# Patient Record
Sex: Male | Born: 1970
Health system: Southern US, Community
[De-identification: ages and names within clinical notes are randomized; demographics above are authoritative.]

## PROBLEM LIST (undated history)

## (undated) DIAGNOSIS — M109 Gout, unspecified: Secondary | ICD-10-CM

## (undated) DIAGNOSIS — K529 Noninfective gastroenteritis and colitis, unspecified: Secondary | ICD-10-CM

## (undated) DIAGNOSIS — K5090 Crohn's disease, unspecified, without complications: Secondary | ICD-10-CM

## (undated) DIAGNOSIS — M1990 Unspecified osteoarthritis, unspecified site: Secondary | ICD-10-CM

## (undated) HISTORY — PX: ANKLE FRACTURE SURGERY: SHX122

---
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
## 2000-04-01 DIAGNOSIS — K529 Noninfective gastroenteritis and colitis, unspecified: Secondary | ICD-10-CM

## 2000-04-01 HISTORY — PX: COLONOSCOPY: SHX174

## 2000-04-01 HISTORY — DX: Noninfective gastroenteritis and colitis, unspecified: K52.9

## 2003-04-02 HISTORY — PX: ANKLE FRACTURE SURGERY: SHX122

## 2013-10-04 ENCOUNTER — Emergency Department (INDEPENDENT_AMBULATORY_CARE_PROVIDER_SITE_OTHER)
Admission: EM | Admit: 2013-10-04 | Discharge: 2013-10-04 | Disposition: A | Discharge status: Home / Self Care | Payer: PRIVATE HEALTH INSURANCE | Source: Home / Self Care | Attending: Emergency Medicine | Admitting: Emergency Medicine

## 2013-10-04 ENCOUNTER — Encounter: Payer: Self-pay | Admitting: Emergency Medicine

## 2013-10-04 DIAGNOSIS — N509 Disorder of male genital organs, unspecified: Secondary | ICD-10-CM

## 2013-10-04 DIAGNOSIS — N5082 Scrotal pain: Secondary | ICD-10-CM

## 2013-10-04 DIAGNOSIS — N649 Disorder of breast, unspecified: Secondary | ICD-10-CM

## 2013-10-04 DIAGNOSIS — N6489 Other specified disorders of breast: Secondary | ICD-10-CM

## 2013-10-04 HISTORY — DX: Unspecified osteoarthritis, unspecified site: M19.90

## 2013-10-04 HISTORY — DX: Crohn's disease, unspecified, without complications: K50.90

## 2013-10-04 HISTORY — DX: Gout, unspecified: M10.9

## 2013-10-04 LAB — POCT URINALYSIS DIP (MANUAL ENTRY)
BILIRUBIN UA: NEGATIVE
Glucose, UA: NEGATIVE
Leukocytes, UA: NEGATIVE
Nitrite, UA: NEGATIVE
PH UA: 5.5 (ref 5–8)
Urobilinogen, UA: 0.2 (ref 0–1)

## 2013-10-04 MED ORDER — SULFAMETHOXAZOLE-TRIMETHOPRIM 800-160 MG PO TABS: 1.0000 | ORAL_TABLET | Freq: Two times a day (BID) | ORAL | Status: DC

## 2013-10-04 NOTE — ED Provider Notes (Signed)
CSN: [id number]     Arrival date & time 10/04/13  1237 History   First MD Initiated Contact with Patient 10/04/13 1242     Chief Complaint  Patient presents with  . Breast Problem  . Testicle Pain   (Consider location/radiation/quality/duration/timing/severity/associated sxs/prior Treatment) HPI This patient presents with multiple complaints today. He does not have a PCP.Marland Kitchen 1) He has a history of gout but does not current flair and fast for maintenance medication. 2) He has a lesion on his right nipple that has been there for 2 weeks.  Feels like a pimple.  No  Fever, chills, nausea, vomiting, galactorrhea, discharge, bloody discharge.   3) he complains of testicular pain the last month.  No hematuria, dysuria, frequency.  No penile discharge.  Sexually active with girlfriend.  She did not have any symptoms.  No difficulty initiating urine stream.  No history of prostate problems, however he does have to go to the bathroom once at night.  He attributes this to his Crohn's disease more so.   Past Medical History  Diagnosis Date  . Gout   . Crohn disease   . Arthritis    Past Surgical History  Procedure Laterality Date  . Ankle fracture surgery Right    Family History  Problem Relation Age of Onset  . Diabetes Mother    History  Substance Use Topics  . Smoking status: Current Every Day Smoker    Types: Cigars  . Smokeless tobacco: Not on file  . Alcohol Use: Yes    Review of Systems  All other systems reviewed and are negative.   Allergies  Review of patient's allergies indicates no known allergies.  Home Medications   Prior to Admission medications   Medication Sig Start Date End Date Taking? Authorizing Provider  sulfamethoxazole-trimethoprim (SEPTRA DS) 800-160 MG per tablet Take 1 tablet by mouth 2 (two) times daily. 10/04/13   Janeann Forehand, MD   BP 126/80  Pulse 74  Temp(Src) 98.2 F (36.8 C) (Oral)  Resp 18  Ht 6' (1.829 m)  Wt 209 lb (94.802 kg)  BMI  28.34 kg/m2  SpO2 98% Physical Exam  Nursing note and vitals reviewed. Constitutional: He is oriented to person, place, and time. Vital signs are normal. He appears well-developed and well-nourished.  Non-toxic appearance. He does not appear ill.  HENT:  Head: Normocephalic and atraumatic.  Eyes: No scleral icterus.  Neck: Neck supple.  Cardiovascular: Regular rhythm and normal heart sounds.   Pulmonary/Chest: Effort normal and breath sounds normal. No respiratory distress.    Small 1/4 centimeter pustule on the pole, nontender, no discharge.  No breast tissue lesions felt.  Small amount of induration but no fluctuance locally.  No cellulitis.  Abdominal: Hernia confirmed negative in the right inguinal area and confirmed negative in the left inguinal area.  Genitourinary: Testes normal and penis normal. Right testis shows no mass, no swelling and no tenderness. Left testis shows no mass, no swelling and no tenderness.  Neurological: He is alert and oriented to person, place, and time.  Skin: Skin is warm and dry.  Psychiatric: He has a normal mood and affect. His speech is normal.    ED Course  Procedures (including critical care time) Labs Review Labs Reviewed  URINE CULTURE  POCT URINALYSIS DIP (MANUAL ENTRY)    Imaging Review No results found.   MDM   1. Scrotum pain   2. Nipple lesion    1) Patient given information for next-door primary  care physicians so they can follow him chronically for gouty symptoms.  In the meantime can take anti-inflammatories over-the-counter if necessary but currently no symptoms.  He will gonow and make an appointment for follow up of these problems.  2) this is an unknown type of nipple lesion, however looks like a back up doctor versus very small abscess.  I'm going to give him a prescription for Bactrim.  If not improving then may need to have a 90, however I would like to see if the antibiotics help first.  Warm compresses encouraged which  may help rupture on its own. 3) UA & UCx obtained.  Due to age, likely not GC/Chl, but if not improved would consider this.  No signs c/w torsion.  DDx includes epidydmitis/prostatitis.  Will give Bactrim DS to treat this as above as well.      Janeann Forehand, MD 10/04/13 [phone]

## 2013-10-04 NOTE — ED Notes (Signed)
Pt c/o RT nipple abscess x 2 wks with some pain. Denies any breast mass. He also c/o problem with his scrotum x 1 mth. Denies dysuria.

## 2013-10-05 LAB — URINE CULTURE
Colony Count: NO GROWTH
Organism ID, Bacteria: NO GROWTH

## 2013-10-07 ENCOUNTER — Telehealth: Payer: Self-pay | Admitting: Emergency Medicine

## 2013-10-15 ENCOUNTER — Encounter: Payer: Self-pay | Admitting: Family Medicine

## 2013-10-15 ENCOUNTER — Ambulatory Visit (INDEPENDENT_AMBULATORY_CARE_PROVIDER_SITE_OTHER): Payer: PRIVATE HEALTH INSURANCE | Admitting: Family Medicine

## 2013-10-15 VITALS — BP 105/65 | HR 80 | Ht 72.0 in | Wt 208.0 lb

## 2013-10-15 DIAGNOSIS — M109 Gout, unspecified: Secondary | ICD-10-CM | POA: Insufficient documentation

## 2013-10-15 DIAGNOSIS — K219 Gastro-esophageal reflux disease without esophagitis: Secondary | ICD-10-CM | POA: Insufficient documentation

## 2013-10-15 DIAGNOSIS — M1A0710 Idiopathic chronic gout, right ankle and foot, without tophus (tophi): Secondary | ICD-10-CM

## 2013-10-15 DIAGNOSIS — M1A00X0 Idiopathic chronic gout, unspecified site, without tophus (tophi): Secondary | ICD-10-CM

## 2013-10-15 DIAGNOSIS — Z720 Tobacco use: Secondary | ICD-10-CM | POA: Insufficient documentation

## 2013-10-15 DIAGNOSIS — Z0000 Encounter for general adult medical examination without abnormal findings: Secondary | ICD-10-CM

## 2013-10-15 DIAGNOSIS — F17200 Nicotine dependence, unspecified, uncomplicated: Secondary | ICD-10-CM

## 2013-10-15 MED ORDER — ALLOPURINOL 100 MG PO TABS: 100.0000 mg | ORAL_TABLET | Freq: Every day | ORAL | Status: DC

## 2013-10-15 MED ORDER — COLCHICINE 0.6 MG PO CAPS: 0.6000 | ORAL_CAPSULE | Freq: Every day | ORAL | Status: DC

## 2013-10-15 NOTE — Progress Notes (Signed)
Subjective:    Patient ID: Andre Henderson, male    DOB: [birth year], 42 y.o.   MRN: [id number]  HPI Was told at one point may have crohn's . Gets blood in the stool about twice a month.  Say will see it on the paper. Says diet seem to really affect his symptoms.   Hx of surgery on right ankle after an accident.    Hx of gout.  Taking a cherry seed extract.  Dx about 3-4 months ago.  Thought has had sxs on and off x 4 years.  He does eat a lot of red meat.    GERD - Admits to eating greasy foods.  Drinks beer  Has a high stress job.  Doesn't get a lot of sleep. Drink a lot of mountain Dew.   Review of Systems  Constitutional: Negative for fever, diaphoresis and unexpected weight change.  HENT: Negative for hearing loss, rhinorrhea, sneezing and tinnitus.   Eyes: Positive for visual disturbance.  Respiratory: Positive for cough. Negative for wheezing.   Cardiovascular: Negative for chest pain and palpitations.  Gastrointestinal: Positive for blood in stool. Negative for nausea, vomiting and diarrhea.  Genitourinary: Negative for dysuria and discharge.  Musculoskeletal: Negative for arthralgias and myalgias.  Skin: Negative for rash.  Neurological: Negative for headaches.  Hematological: Negative for adenopathy.  Psychiatric/Behavioral: Positive for sleep disturbance. Negative for dysphoric mood. The patient is not nervous/anxious.         BP 105/65  Pulse 80  Ht 6' (1.829 m)  Wt 208 lb (94.348 kg)  BMI 28.20 kg/m2    No Known Allergies  Past Medical History  Diagnosis Date  . Gout   . Crohn disease   . Arthritis     Past Surgical History  Procedure Laterality Date  . Ankle fracture surgery Right     History   Social History  . Marital Status: Single    Spouse Name: [name]    Number of Children: N/A  . Years of Education: N/A   Occupational History  . SALES MANAGER    Social History Main Topics  . Smoking status: Current Every Day Smoker    Types: Cigars   . Smokeless tobacco: Not on file  . Alcohol Use: 6.0 oz/week    10 Cans of beer per week  . Drug Use: No  . Sexual Activity: Yes    Partners: Female   Other Topics Concern  . Not on file   Social History Narrative   4 sodas per day. No regular exercise. Does smoke a cigar from time to time. 10 beers per week. He currently lives with [name] currently works at [employer] History  Problem Relation Age of Onset  . Diabetes Mother   . Hyperlipidemia Mother   . Hypertension Mother     Outpatient Encounter Prescriptions as of 10/15/2013  Medication Sig  . allopurinol (ZYLOPRIM) 100 MG tablet Take 1 tablet (100 mg total) by mouth daily.  . Colchicine (MITIGARE) 0.6 MG CAPS Take 0.6 capsules by mouth daily.  . [DISCONTINUED] sulfamethoxazole-trimethoprim (SEPTRA DS) 800-160 MG per tablet Take 1 tablet by mouth 2 (two) times daily.       Objective:   Physical Exam  Constitutional: He is oriented to person, place, and time. He appears well-developed and well-nourished.  HENT:  Head: Normocephalic and atraumatic.  Neck: Neck supple. No thyromegaly present.  Cardiovascular: Normal rate, regular rhythm and normal heart sounds.  No carotid bruits  Pulmonary/Chest: Effort normal and breath sounds normal.  Neurological: He is alert and oriented to person, place, and time.  Skin: Skin is warm and dry.  Psychiatric: He has a normal mood and affect. His behavior is normal.          Assessment & Plan:  Gout - Discussed dx. we also discussed the difference between prophylactic treatment to prevent destruction the joint vessels acute treatment. We discussed the pros and cons of the medication and the need to follow uric acid levels. Will try call the urgent care to get a copy of his previous uric acid drawn about 2-3 months ago. We'll go ahead and start him on allopurinol and colchicine for 6 months. At that point time we'll stop the colchicine and does use  it as needed but continue the allopurinol. We will need to recheck uric acid in about 2-3 months that we can adjust his dose.  GERD- controlled on PPI.  We discussed the importance of really controlling diet which she has not done a good job of to really reduce his use of PPIs. We did discuss the chronic use of PPIs increased risk of fractures and bowel infections.  Strongly encouraged him to schedule a physical so that we can get blood work and get him up-to-date on vaccines et Ronney Asters. He has never had a complete physical and does not report having any recent vaccinations done.

## 2013-10-29 ENCOUNTER — Encounter: Payer: PRIVATE HEALTH INSURANCE | Admitting: Family Medicine

## 2013-11-18 LAB — COMPLETE METABOLIC PANEL WITH GFR
ALBUMIN: 4.4 g/dL (ref 3.5–5.2)
ALT: 69 U/L — ABNORMAL HIGH (ref 0–53)
AST: 31 U/L (ref 0–37)
Alkaline Phosphatase: 45 U/L (ref 39–117)
BUN: 15 mg/dL (ref 6–23)
CHLORIDE: 99 meq/L (ref 96–112)
CO2: 27 mEq/L (ref 19–32)
Calcium: 9.9 mg/dL (ref 8.4–10.5)
Creat: 0.93 mg/dL (ref 0.50–1.35)
GFR, Est African American: >89 mL/min
GLUCOSE: 111 mg/dL — AB (ref 70–99)
POTASSIUM: 4.6 meq/L (ref 3.5–5.3)
Sodium: 136 mEq/L (ref 135–145)
TOTAL PROTEIN: 7.2 g/dL (ref 6.0–8.3)
Total Bilirubin: 0.4 mg/dL (ref 0.2–1.2)

## 2013-11-18 LAB — LIPID PANEL
CHOLESTEROL: 216 mg/dL — AB (ref 0–200)
HDL: 45 mg/dL (ref >39)
LDL Cholesterol: 115 mg/dL — ABNORMAL HIGH (ref 0–99)
Total CHOL/HDL Ratio: 4.8 Ratio
Triglycerides: 281 mg/dL — ABNORMAL HIGH (ref <150)
VLDL: 56 mg/dL — ABNORMAL HIGH (ref 0–40)

## 2013-11-18 LAB — URIC ACID: URIC ACID, SERUM: 10.6 mg/dL — AB (ref 4.0–7.8)

## 2013-11-19 ENCOUNTER — Encounter: Payer: Self-pay | Admitting: Family Medicine

## 2013-11-19 ENCOUNTER — Ambulatory Visit (INDEPENDENT_AMBULATORY_CARE_PROVIDER_SITE_OTHER): Payer: PRIVATE HEALTH INSURANCE | Admitting: Family Medicine

## 2013-11-19 VITALS — BP 126/74 | HR 80 | Wt 208.0 lb

## 2013-11-19 DIAGNOSIS — R7309 Other abnormal glucose: Secondary | ICD-10-CM

## 2013-11-19 DIAGNOSIS — E1165 Type 2 diabetes mellitus with hyperglycemia: Secondary | ICD-10-CM | POA: Insufficient documentation

## 2013-11-19 DIAGNOSIS — R7301 Impaired fasting glucose: Secondary | ICD-10-CM | POA: Insufficient documentation

## 2013-11-19 DIAGNOSIS — Z0000 Encounter for general adult medical examination without abnormal findings: Secondary | ICD-10-CM

## 2013-11-19 DIAGNOSIS — E119 Type 2 diabetes mellitus without complications: Secondary | ICD-10-CM | POA: Insufficient documentation

## 2013-11-19 DIAGNOSIS — Z23 Encounter for immunization: Secondary | ICD-10-CM

## 2013-11-19 LAB — POCT GLYCOSYLATED HEMOGLOBIN (HGB A1C): Hemoglobin A1C: 5.9

## 2013-11-19 MED ORDER — ALLOPURINOL 100 MG PO TABS: 200.0000 mg | ORAL_TABLET | Freq: Every day | ORAL | Status: DC

## 2013-11-19 NOTE — Addendum Note (Signed)
Addended by: Teddy Spike on: 11/19/2013 05:47 PM   Modules accepted: Orders

## 2013-11-19 NOTE — Progress Notes (Signed)
Subjective:    Patient ID: Andre Henderson, male    DOB: 01/18/1971, 42 y.o.   MRN: [id number]  HPI Here for CPE today. No specific complaints.    Review of Systems  BP 126/74  Pulse 80  Wt 208 lb (94.348 kg)    No Known Allergies  Past Medical History  Diagnosis Date  . Gout   . Crohn disease   . Arthritis     Past Surgical History  Procedure Laterality Date  . Ankle fracture surgery Right     History   Social History  . Marital Status: Single    Spouse Name: [name]    Number of Children: N/A  . Years of Education: N/A   Occupational History  . SALES MANAGER    Social History Main Topics  . Smoking status: Current Every Day Smoker    Types: Cigars  . Smokeless tobacco: Not on file  . Alcohol Use: 6.0 oz/week    10 Cans of beer per week  . Drug Use: No  . Sexual Activity: Yes    Partners: Female   Other Topics Concern  . Not on file   Social History Narrative   4 sodas per day. No regular exercise. Does smoke a cigar from time to time. 10 beers per week. He currently lives with [name] currently works at [employer] History  Problem Relation Age of Onset  . Diabetes Mother   . Hyperlipidemia Mother   . Hypertension Mother     Outpatient Encounter Prescriptions as of 11/19/2013  Medication Sig  . allopurinol (ZYLOPRIM) 100 MG tablet Take 2 tablets (200 mg total) by mouth daily.  . Colchicine (MITIGARE) 0.6 MG CAPS Take 0.6 capsules by mouth daily.  . [DISCONTINUED] allopurinol (ZYLOPRIM) 100 MG tablet Take 1 tablet (100 mg total) by mouth daily.          Objective:   Physical Exam  Constitutional: He is oriented to person, place, and time. He appears well-developed and well-nourished.  HENT:  Head: Normocephalic and atraumatic.  Right Ear: External ear normal.  Left Ear: External ear normal.  Nose: Nose normal.  Mouth/Throat: Oropharynx is clear and moist.  Eyes: Conjunctivae and EOM are normal. Pupils  are equal, round, and reactive to light.  Neck: Normal range of motion. Neck supple. No thyromegaly present.  Cardiovascular: Normal rate, regular rhythm, normal heart sounds and intact distal pulses.   Pulmonary/Chest: Effort normal and breath sounds normal.  Abdominal: Soft. Bowel sounds are normal. He exhibits no distension and no mass. There is no tenderness. There is no rebound and no guarding.  Musculoskeletal: Normal range of motion.  Lymphadenopathy:    He has no cervical adenopathy.  Neurological: He is alert and oriented to person, place, and time. He has normal reflexes.  Skin: Skin is warm and dry.  Psychiatric: He has a normal mood and affect. His behavior is normal. Judgment and thought content normal.          Assessment & Plan:  CPE:  Keep up a regular exercise program and make sure you are eating a healthy diet Try to eat 4 servings of dairy a day, or if you are lactose intolerant take a calcium with vitamin D daily.  Your vaccines are up to date.  Tdap updated today.   Abnormal glucose - A1C in the IFG zone. Discussed working on diet and exercise.  Really work on decreasing Mtn Dew significantly.  Says  drinks about 4-5 12oz cans per day.  Recheck in 6 months. Also work on increasing acitivity levels.

## 2014-05-23 ENCOUNTER — Ambulatory Visit: Payer: PRIVATE HEALTH INSURANCE | Admitting: Family Medicine

## 2015-02-10 ENCOUNTER — Ambulatory Visit (INDEPENDENT_AMBULATORY_CARE_PROVIDER_SITE_OTHER): Payer: 59 | Admitting: Family Medicine

## 2015-02-10 ENCOUNTER — Encounter: Payer: Self-pay | Admitting: Family Medicine

## 2015-02-10 VITALS — BP 150/78 | HR 82 | Temp 98.4°F | Resp 18 | Wt 204.9 lb

## 2015-02-10 DIAGNOSIS — K219 Gastro-esophageal reflux disease without esophagitis: Secondary | ICD-10-CM

## 2015-02-10 DIAGNOSIS — M10071 Idiopathic gout, right ankle and foot: Secondary | ICD-10-CM

## 2015-02-10 DIAGNOSIS — G4700 Insomnia, unspecified: Secondary | ICD-10-CM | POA: Diagnosis not present

## 2015-02-10 DIAGNOSIS — R7301 Impaired fasting glucose: Secondary | ICD-10-CM

## 2015-02-10 LAB — POCT GLYCOSYLATED HEMOGLOBIN (HGB A1C): HEMOGLOBIN A1C: 5.8

## 2015-02-10 MED ORDER — PREDNISONE 20 MG PO TABS: ORAL_TABLET | ORAL | Status: DC

## 2015-02-10 MED ORDER — ALLOPURINOL 100 MG PO TABS
200.0000 mg | ORAL_TABLET | Freq: Every day | ORAL | Status: DC
Start: 2015-02-10 — End: 2015-03-31

## 2015-02-10 MED ORDER — COLCHICINE 0.6 MG PO CAPS: 0.6000 | ORAL_CAPSULE | Freq: Every day | ORAL | Status: DC

## 2015-02-10 NOTE — Patient Instructions (Signed)
Insomnia Insomnia is a sleep disorder that makes it difficult to fall asleep or to stay asleep. Insomnia can cause tiredness (fatigue), low energy, difficulty concentrating, mood swings, and poor performance at work or school.  There are three different ways to classify insomnia:  Difficulty falling asleep.  Difficulty staying asleep.  Waking up too early in the morning. Any type of insomnia can be long-term (chronic) or short-term (acute). Both are common. Short-term insomnia usually lasts for three months or less. Chronic insomnia occurs at least three times a week for longer than three months. CAUSES  Insomnia may be caused by another condition, situation, or substance, such as:  Anxiety.  Certain medicines.  Gastroesophageal reflux disease (GERD) or other gastrointestinal conditions.  Asthma or other breathing conditions.  Restless legs syndrome, sleep apnea, or other sleep disorders.  Chronic pain.  Menopause. This may include hot flashes.  Stroke.  Abuse of alcohol, tobacco, or illegal drugs.  Depression.  Caffeine.   Neurological disorders, such as Alzheimer disease.  An overactive thyroid (hyperthyroidism). The cause of insomnia may not be known. RISK FACTORS Risk factors for insomnia include:  Gender. Women are more commonly affected than men.  Age. Insomnia is more common as you get older.  Stress. This may involve your professional or personal life.  Income. Insomnia is more common in people with lower income.  Lack of exercise.   Irregular work schedule or night shifts.  Traveling between different time zones. SIGNS AND SYMPTOMS If you have insomnia, trouble falling asleep or trouble staying asleep is the main symptom. This may lead to other symptoms, such as:  Feeling fatigued.  Feeling nervous about going to sleep.  Not feeling rested in the morning.  Having trouble concentrating.  Feeling irritable, anxious, or depressed. TREATMENT   Treatment for insomnia depends on the cause. If your insomnia is caused by an underlying condition, treatment will focus on addressing the condition. Treatment may also include:   Medicines to help you sleep.  Counseling or therapy.  Lifestyle adjustments. HOME CARE INSTRUCTIONS   Take medicines only as directed by your health care provider.  Keep regular sleeping and waking hours. Avoid naps.  Keep a sleep diary to help you and your health care provider figure out what could be causing your insomnia. Include:   When you sleep.  When you wake up during the night.  How well you sleep.   How rested you feel the next day.  Any side effects of medicines you are taking.  What you eat and drink.   Make your bedroom a comfortable place where it is easy to fall asleep:  Put up shades or special blackout curtains to block light from outside.  Use a white noise machine to block noise.  Keep the temperature cool.   Exercise regularly as directed by your health care provider. Avoid exercising right before bedtime.  Use relaxation techniques to manage stress. Ask your health care provider to suggest some techniques that may work well for you. These may include:  Breathing exercises.  Routines to release muscle tension.  Visualizing peaceful scenes.  Cut back on alcohol, caffeinated beverages, and cigarettes, especially close to bedtime. These can disrupt your sleep.  Do not overeat or eat spicy foods right before bedtime. This can lead to digestive discomfort that can make it hard for you to sleep.  Limit screen use before bedtime. This includes:  Watching TV.  Using your smartphone, tablet, and computer.  Stick to a routine. This   can help you fall asleep faster. Try to do a quiet activity, brush your teeth, and go to bed at the same time each night.  Get out of bed if you are still awake after 15 minutes of trying to sleep. Keep the lights down, but try reading or  doing a quiet activity. When you feel sleepy, go back to bed.  Make sure that you drive carefully. Avoid driving if you feel very sleepy.  Keep all follow-up appointments as directed by your health care provider. This is important. SEEK MEDICAL CARE IF:   You are tired throughout the day or have trouble in your daily routine due to sleepiness.  You continue to have sleep problems or your sleep problems get worse. SEEK IMMEDIATE MEDICAL CARE IF:   You have serious thoughts about hurting yourself or someone else.   This information is not intended to replace advice given to you by your health care provider. Make sure you discuss any questions you have with your health care provider.   Document Released: 03/15/2000 Document Revised: 12/07/2014 Document Reviewed: 12/17/2013 Elsevier Interactive Patient Education 2016 [employer] Inc[employer]  

## 2015-02-10 NOTE — Progress Notes (Signed)
Subjective:    Patient ID: Andre Henderson, male    DOB: 1970/12/07, 44 y.o.   MRN: [id number]  HPI Right foot pain - started 4 days ago.  Says hot warm, swollen.  She took a hydrocodone. Says it is a little better today. Has been limping.   IFG - No in thirst or urination. She take  PPI - says takes nexium once a day and say some concern on TV about renal function.    Insomnia - he has not been sleeping well for about the last 6 months or so. He got a new job and he is working in [employer]. It's been very difficult to fall asleep at night and says on average she's only getting about 2-3 hours per night. He's tried several over-the-counter medications. He said medications like NyQuil actually seemed to make him more hyper.  Review of Systems     BP 150/78 mmHg  Pulse 82  Temp(Src) 98.4 F (36.9 C) (Oral)  Resp 18  Wt 204 lb 14.4 oz (92.942 kg)  SpO2 98%    No Known Allergies  Past Medical History  Diagnosis Date  . Gout   . Crohn disease ([place])   . Arthritis     Past Surgical History  Procedure Laterality Date  . Ankle fracture surgery Right     Social History   Social History  . Marital Status: Single    Spouse Name: [name]  . Number of Children: N/A  . Years of Education: N/A   Occupational History  . SALES MANAGER    Social History Main Topics  . Smoking status: Current Every Day Smoker    Types: Cigars  . Smokeless tobacco: Not on file  . Alcohol Use: 6.0 oz/week    10 Cans of beer per week  . Drug Use: No  . Sexual Activity:    Partners: Female   Other Topics Concern  . Not on file   Social History Narrative   4 sodas per day. No regular exercise. Does smoke a cigar from time to time. 10 beers per week. He currently lives with [name] currently works at [employer] History  Problem Relation Age of Onset  . Diabetes Mother   . Hyperlipidemia Mother   . Hypertension Mother     Outpatient Encounter Prescriptions as  of 02/10/2015  Medication Sig  . allopurinol (ZYLOPRIM) 100 MG tablet Take 2 tablets (200 mg total) by mouth daily.  . Colchicine (MITIGARE) 0.6 MG CAPS Take 0.6 capsules by mouth daily.  . predniSONE (DELTASONE) 20 MG tablet 2 tabs po QD x 5 days, then 1 tab po QD x 5 day, then half a tab po QD x 5 days.  . [DISCONTINUED] allopurinol (ZYLOPRIM) 100 MG tablet Take 2 tablets (200 mg total) by mouth daily. (Patient not taking: Reported on 02/10/2015)  . [DISCONTINUED] Colchicine (MITIGARE) 0.6 MG CAPS Take 0.6 capsules by mouth daily. (Patient not taking: Reported on 02/10/2015)   No facility-administered encounter medications on file as of 02/10/2015.       Objective:   Physical Exam  Constitutional: He is oriented to person, place, and time. He appears well-developed and well-nourished.  HENT:  Head: Normocephalic and atraumatic.  Cardiovascular: Normal rate, regular rhythm and normal heart sounds.   Pulmonary/Chest: Effort normal and breath sounds normal.  Musculoskeletal:  Top of the right foot is warm, mildly red and swollen. Ankle with NROM.   Neurological: He is alert  and oriented to person, place, and time.  Skin: Skin is warm and dry.  Psychiatric: He has a normal mood and affect. His behavior is normal.          Assessment & Plan:  Gout flare - will give him prednisone for the acute flare. Call if not improving. He should be okay to return to work on Monday. Will restart allopurinol and culture seen towards the end of the prednisone taper. I like to see him back in 6 months for gout.  IFG - A1C of 5.8.  Stable.  Follow-up in 6 months.  GERD-he is taking a safe dose of his PPI and not taking excessively. We did discuss long-term potential side effects of the medication and encouraged him to wean down to every other day or maybe even every third day if tolerated well. Also work on diet dairy measures. He does drink a lot of soda which can worsen GERD. He is well aware of  this.   Insomnia - discussed options. Did strongly encouraged him to cut back on caffeine after lunch time. He admits he drinks a soda right before bedtime. We also discussed keeping his sleep environment cool, dark, quiet etc. Remove any animals from the bedroom as this can sometimes cause awakening. If this is not helping then we can consider medication management.  He plans to schedule a physical in the next couple months and will get blood work at that time.

## 2015-02-14 ENCOUNTER — Ambulatory Visit: Payer: PRIVATE HEALTH INSURANCE | Admitting: Family Medicine

## 2015-03-30 ENCOUNTER — Ambulatory Visit (INDEPENDENT_AMBULATORY_CARE_PROVIDER_SITE_OTHER): Payer: 59 | Admitting: Family Medicine

## 2015-03-30 ENCOUNTER — Encounter: Payer: Self-pay | Admitting: Family Medicine

## 2015-03-30 VITALS — BP 127/76 | HR 81 | Ht 72.0 in | Wt 209.0 lb

## 2015-03-30 DIAGNOSIS — Z13220 Encounter for screening for lipoid disorders: Secondary | ICD-10-CM | POA: Diagnosis not present

## 2015-03-30 DIAGNOSIS — Z114 Encounter for screening for human immunodeficiency virus [HIV]: Secondary | ICD-10-CM | POA: Diagnosis not present

## 2015-03-30 DIAGNOSIS — M109 Gout, unspecified: Secondary | ICD-10-CM | POA: Diagnosis not present

## 2015-03-30 DIAGNOSIS — Z0000 Encounter for general adult medical examination without abnormal findings: Secondary | ICD-10-CM | POA: Diagnosis not present

## 2015-03-30 LAB — COMPLETE METABOLIC PANEL WITH GFR
ALK PHOS: 41 U/L (ref 40–115)
ALT: 43 U/L (ref 9–46)
AST: 26 U/L (ref 10–40)
Albumin: 4.3 g/dL (ref 3.6–5.1)
BUN: 15 mg/dL (ref 7–25)
CALCIUM: 9.5 mg/dL (ref 8.6–10.3)
CO2: 25 mmol/L (ref 20–31)
Chloride: 103 mmol/L (ref 98–110)
Creat: 1.04 mg/dL (ref 0.60–1.35)
GFR, EST NON AFRICAN AMERICAN: 86 mL/min (ref >=60)
GFR, Est African American: >89 mL/min (ref >=60)
Glucose, Bld: 97 mg/dL (ref 65–99)
POTASSIUM: 4.6 mmol/L (ref 3.5–5.3)
Sodium: 142 mmol/L (ref 135–146)
Total Bilirubin: 0.4 mg/dL (ref 0.2–1.2)
Total Protein: 6.9 g/dL (ref 6.1–8.1)

## 2015-03-30 LAB — LIPID PANEL
CHOL/HDL RATIO: 4.5 ratio (ref <=5.0)
CHOLESTEROL: 203 mg/dL — AB (ref 125–200)
HDL: 45 mg/dL (ref >=40)
LDL Cholesterol: 115 mg/dL (ref <130)
TRIGLYCERIDES: 216 mg/dL — AB (ref <150)
VLDL: 43 mg/dL — ABNORMAL HIGH (ref <30)

## 2015-03-30 LAB — CBC WITH DIFFERENTIAL/PLATELET
Basophils Absolute: 0 10*3/uL (ref 0.0–0.1)
Basophils Relative: 0 % (ref 0–1)
Eosinophils Absolute: 0.2 10*3/uL (ref 0.0–0.7)
Eosinophils Relative: 2 % (ref 0–5)
HEMATOCRIT: 42.8 % (ref 39.0–52.0)
HEMOGLOBIN: 14.9 g/dL (ref 13.0–17.0)
LYMPHS ABS: 2.9 10*3/uL (ref 0.7–4.0)
LYMPHS PCT: 37 % (ref 12–46)
MCH: 31.8 pg (ref 26.0–34.0)
MCHC: 34.8 g/dL (ref 30.0–36.0)
MCV: 91.3 fL (ref 78.0–100.0)
MONO ABS: 0.9 10*3/uL (ref 0.1–1.0)
MPV: 10 fL (ref 8.6–12.4)
Monocytes Relative: 12 % (ref 3–12)
NEUTROS ABS: 3.9 10*3/uL (ref 1.7–7.7)
Neutrophils Relative %: 49 % (ref 43–77)
Platelets: 298 10*3/uL (ref 150–400)
RBC: 4.69 MIL/uL (ref 4.22–5.81)
RDW: 14.2 % (ref 11.5–15.5)
WBC: 7.9 10*3/uL (ref 4.0–10.5)

## 2015-03-30 LAB — URIC ACID: Uric Acid, Serum: 9.9 mg/dL — ABNORMAL HIGH (ref 4.0–7.8)

## 2015-03-30 LAB — HEMOGLOBIN A1C
HEMOGLOBIN A1C: 6 % — AB (ref <5.7)
Mean Plasma Glucose: 126 mg/dL — ABNORMAL HIGH (ref <117)

## 2015-03-30 NOTE — Patient Instructions (Signed)
Keep up a regular exercise program and make sure you are eating a healthy diet Try to eat 4 servings of dairy a day, or if you are lactose intolerant take a calcium with vitamin D daily.  Your vaccines are up to date.   

## 2015-03-30 NOTE — Progress Notes (Signed)
Subjective:    Patient ID: Andre Henderson, male    DOB: 04-08-1970, 44 y.o.   MRN: [id number]  HPI Here for CPE. Is doing well overall. No specific concerns. He has been trying to work out a couple days a week but started that this week. That is part of his New Year's resolution.  Gout-he has been taking his allopurinol intermittently. No recent flares or exacerbations since I last saw him.     Review of Systems Complaints of review of systems is negative. BP 127/76 mmHg  Pulse 81  Ht 6' (1.829 m)  Wt 209 lb (94.802 kg)  BMI 28.34 kg/m2    No Known Allergies  Past Medical History  Diagnosis Date  . Gout   . Crohn disease ([place])   . Arthritis     Past Surgical History  Procedure Laterality Date  . Ankle fracture surgery Right     Social History   Social History  . Marital Status: Single    Spouse Name: [name]  . Number of Children: N/A  . Years of Education: N/A   Occupational History  . SALES MANAGER    Social History Main Topics  . Smoking status: Current Every Day Smoker    Types: Cigars  . Smokeless tobacco: Not on file  . Alcohol Use: 6.0 oz/week    10 Cans of beer per week  . Drug Use: No  . Sexual Activity:    Partners: Female   Other Topics Concern  . Not on file   Social History Narrative   1-2 sodas per day. Some regular exercise. Does smoke a cigar from time to time. 10 beers per week. He currently lives with [name] currently works at [employer].      Family History  Problem Relation Age of Onset  . Diabetes Mother   . Hyperlipidemia Mother   . Hypertension Mother   . Diabetes Paternal Uncle     Outpatient Encounter Prescriptions as of 03/30/2015  Medication Sig  . allopurinol (ZYLOPRIM) 100 MG tablet Take 2 tablets (200 mg total) by mouth daily. (Patient taking differently: Take 200 mg by mouth as needed. )  . Colchicine (MITIGARE) 0.6 MG CAPS Take 0.6 capsules by mouth daily. (Patient taking differently: Take  0.6 capsules by mouth as needed. )  . [DISCONTINUED] predniSONE (DELTASONE) 20 MG tablet 2 tabs po QD x 5 days, then 1 tab po QD x 5 day, then half a tab po QD x 5 days.   No facility-administered encounter medications on file as of 03/30/2015.            Objective:   Physical Exam  Constitutional: He is oriented to person, place, and time. He appears well-developed and well-nourished.  HENT:  Head: Normocephalic and atraumatic.  Right Ear: External ear normal.  Left Ear: External ear normal.  Nose: Nose normal.  Mouth/Throat: Oropharynx is clear and moist.  Eyes: Conjunctivae and EOM are normal. Pupils are equal, round, and reactive to light.  Neck: Normal range of motion. Neck supple. No thyromegaly present.  Cardiovascular: Normal rate, regular rhythm, normal heart sounds and intact distal pulses.   No carotid bruits   Pulmonary/Chest: Effort normal and breath sounds normal.  Abdominal: Soft. Bowel sounds are normal. He exhibits no distension and no mass. There is no tenderness. There is no rebound and no guarding.  Musculoskeletal: Normal range of motion.  Lymphadenopathy:    He has no cervical adenopathy.  Neurological: He is  alert and oriented to person, place, and time. He has normal reflexes.  Skin: Skin is warm and dry.  Psychiatric: He has a normal mood and affect. His behavior is normal. Judgment and thought content normal.          Assessment & Plan:  CPE Keep up a regular exercise program and make sure you are eating a healthy diet Try to eat 4 servings of dairy a day, or if you are lactose intolerant take a calcium with vitamin D daily.  Your vaccines are up to date.

## 2015-03-31 ENCOUNTER — Other Ambulatory Visit: Payer: Self-pay | Admitting: Family Medicine

## 2015-03-31 LAB — HIV ANTIBODY (ROUTINE TESTING W REFLEX): HIV 1&2 Ab, 4th Generation: NONREACTIVE

## 2015-03-31 MED ORDER — ALLOPURINOL 300 MG PO TABS: 300.0000 mg | ORAL_TABLET | Freq: Every day | ORAL | Status: DC

## 2015-04-04 ENCOUNTER — Other Ambulatory Visit: Payer: Self-pay | Admitting: *Deleted

## 2015-06-25 ENCOUNTER — Other Ambulatory Visit: Payer: Self-pay | Admitting: Family Medicine

## 2016-02-08 ENCOUNTER — Ambulatory Visit (INDEPENDENT_AMBULATORY_CARE_PROVIDER_SITE_OTHER): Payer: Commercial Managed Care - PPO

## 2016-02-08 ENCOUNTER — Ambulatory Visit (INDEPENDENT_AMBULATORY_CARE_PROVIDER_SITE_OTHER): Payer: Commercial Managed Care - PPO | Admitting: Family Medicine

## 2016-02-08 ENCOUNTER — Encounter: Payer: Self-pay | Admitting: Family Medicine

## 2016-02-08 VITALS — BP 131/81 | HR 69 | Wt 211.0 lb

## 2016-02-08 DIAGNOSIS — R9389 Abnormal findings on diagnostic imaging of other specified body structures: Secondary | ICD-10-CM

## 2016-02-08 DIAGNOSIS — R938 Abnormal findings on diagnostic imaging of other specified body structures: Secondary | ICD-10-CM | POA: Diagnosis not present

## 2016-02-08 DIAGNOSIS — R918 Other nonspecific abnormal finding of lung field: Secondary | ICD-10-CM | POA: Diagnosis not present

## 2016-02-08 DIAGNOSIS — L739 Follicular disorder, unspecified: Secondary | ICD-10-CM

## 2016-02-08 DIAGNOSIS — R0789 Other chest pain: Secondary | ICD-10-CM | POA: Diagnosis not present

## 2016-02-08 LAB — CBC WITH DIFFERENTIAL/PLATELET
Basophils Absolute: 0 cells/uL (ref 0–200)
Basophils Relative: 0 %
Eosinophils Absolute: 91 cells/uL (ref 15–500)
Eosinophils Relative: 1 %
HEMATOCRIT: 43.6 % (ref 38.5–50.0)
HEMOGLOBIN: 15 g/dL (ref 13.2–17.1)
LYMPHS ABS: 3549 {cells}/uL (ref 850–3900)
Lymphocytes Relative: 39 %
MCH: 31.2 pg (ref 27.0–33.0)
MCHC: 34.4 g/dL (ref 32.0–36.0)
MCV: 90.6 fL (ref 80.0–100.0)
MONO ABS: 910 {cells}/uL (ref 200–950)
MPV: 9.8 fL (ref 7.5–12.5)
Monocytes Relative: 10 %
NEUTROS ABS: 4550 {cells}/uL (ref 1500–7800)
Neutrophils Relative %: 50 %
Platelets: 302 10*3/uL (ref 140–400)
RBC: 4.81 MIL/uL (ref 4.20–5.80)
RDW: 14.5 % (ref 11.0–15.0)
WBC: 9.1 10*3/uL (ref 3.8–10.8)

## 2016-02-08 LAB — COMPLETE METABOLIC PANEL WITH GFR
ALT: 41 U/L (ref 9–46)
AST: 22 U/L (ref 10–40)
Albumin: 4.6 g/dL (ref 3.6–5.1)
Alkaline Phosphatase: 46 U/L (ref 40–115)
BILIRUBIN TOTAL: 0.3 mg/dL (ref 0.2–1.2)
BUN: 13 mg/dL (ref 7–25)
CHLORIDE: 101 mmol/L (ref 98–110)
CO2: 27 mmol/L (ref 20–31)
Calcium: 9.8 mg/dL (ref 8.6–10.3)
Creat: 0.97 mg/dL (ref 0.60–1.35)
GFR, Est African American: >89 mL/min (ref >=60)
GFR, Est Non African American: >89 mL/min (ref >=60)
Glucose, Bld: 89 mg/dL (ref 65–99)
Potassium: 4.6 mmol/L (ref 3.5–5.3)
Sodium: 139 mmol/L (ref 135–146)
TOTAL PROTEIN: 7.3 g/dL (ref 6.1–8.1)

## 2016-02-08 LAB — TROPONIN I: Troponin I: <0.01 ng/mL (ref <=0.05)

## 2016-02-08 LAB — D-DIMER, QUANTITATIVE (NOT AT ARMC): D-Dimer, Quant: <0.19 mcg/mL FEU (ref <0.50)

## 2016-02-08 MED ORDER — CEPHALEXIN 500 MG PO CAPS: 500.0000 mg | ORAL_CAPSULE | Freq: Three times a day (TID) | ORAL | 0 refills | Status: DC

## 2016-02-08 MED ORDER — MUPIROCIN 2 % EX OINT: TOPICAL_OINTMENT | Freq: Two times a day (BID) | CUTANEOUS | 0 refills | Status: DC | PRN

## 2016-02-08 NOTE — Progress Notes (Addendum)
Subjective:    Patient ID: Andre Henderson, male    DOB: [birth year], 45 y.o.   MRN: [id number]  HPI Patient comes in today complaining of chest pain that started yesterday. He has a day off and was just lying around in bed when he started to notice some discomfort. He denies any radiation up into the jaw or into his arm. No diaphoresis. No nausea or vomiting. He says it got a little better and then this morning when he was driving he noticed that it felt like it was becoming more of a sharp pain. It does seem to come and go. He denies any heartburn or reflux symptoms. No trauma or heavy lifting recently.  He also has a rash underneath both axilla. He says he was getting these red bumps with whiteheads area he thought maybe was his deodorant so he changed deodorants and make sure he was not applying it over that area and they still continue to persist. He really hasn't figured out any contribute factors.  He denies any new soaps lotions etc.   Review of Systems  BP 131/81   Pulse 69   Wt 211 lb (95.7 kg)   SpO2 100%   BMI 28.62 kg/m     No Known Allergies  Past Medical History:  Diagnosis Date  . Arthritis   . Crohn disease ([place])   . Gout     Past Surgical History:  Procedure Laterality Date  . ANKLE FRACTURE SURGERY Right     Social History   Social History  . Marital status: Single    Spouse name: [name]  . Number of children: N/A  . 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 Smoking status: Current Every Day Smoker    Types: Cigars  . Smokeless tobacco: Not on file  . Alcohol use 6.0 oz/week    10 Cans of beer per week  . Drug use: No  . Sexual activity: Yes    Partners: Female   Other Topics Concern  . Not on file   Social History Narrative   1-2 sodas per day. Some regular exercise. Does smoke a cigar from time to time. 10 beers per week. He currently lives with [name]  currently works at [employer].      Family History  Problem Relation Age of Onset  . Diabetes Mother   . Hyperlipidemia Mother   . Hypertension Mother   . Diabetes Paternal Uncle     Outpatient Encounter Prescriptions as of 02/08/2016  Medication Sig  . allopurinol (ZYLOPRIM) 300 MG tablet Take 1 tablet (300 mg total) by mouth daily.  Marland Kitchen omeprazole (PRILOSEC) 10 MG capsule Take by mouth.  . cephALEXin (KEFLEX) 500 MG capsule Take 1 capsule (500 mg total) by mouth 3 (three) times daily.  . mupirocin ointment (BACTROBAN) 2 % Apply topically 2 (two) times daily as needed.  . [DISCONTINUED] Colchicine 0.6 MG CAPS TAKE ONE CAPSULE BY MOUTH DAILY (Patient not taking: Reported on 02/08/2016)   No facility-administered encounter medications on file as of 02/08/2016.          Objective:   Physical Exam  Constitutional: He is oriented to person, place, and time. He appears well-developed and well-nourished.  HENT:  Head: Normocephalic and atraumatic.  Cardiovascular: Normal rate, regular rhythm and normal heart sounds.   Pulmonary/Chest: Effort normal and breath sounds normal.  Musculoskeletal: He exhibits  no edema.  Neurological: He is alert and oriented to person, place, and time. No cranial nerve deficit.  Skin: Skin is warm and dry.  He does have several erythematous oval-shaped papules a couple with some postural heads underneath the axilla, below the hairline bilaterally.  Psychiatric: He has a normal mood and affect. His behavior is normal.          Assessment & Plan:  Atypical chest pain-I think most consistent with costochondritis is unable to find an area of point tenderness where the breastbone meets the rib on the left side. Recommend anti-inflammatory for the next 7-10 days. Will go ahead and go and get a chest x-ray and lab work just to rule out any type of cardiac issue since he is over 55. EKG today shows rate of 64 bpm, normal sinus rhythm with no acute  ST-T wave changes. This x-ray showed a questionable area in the recommended CT for further evaluation. Order placed today.  Folliculitis- we will treat with Keflex since he has several lesions that are actually pretty large emesis centimeter in size. We'll also give him mupirocin ointment to place topically.

## 2016-02-12 NOTE — Addendum Note (Signed)
Addended by: Beatrice Lecher D on: 02/12/2016 04:11 PM   Modules accepted: Orders

## 2016-02-14 ENCOUNTER — Ambulatory Visit (INDEPENDENT_AMBULATORY_CARE_PROVIDER_SITE_OTHER): Payer: Commercial Managed Care - PPO

## 2016-02-14 DIAGNOSIS — R079 Chest pain, unspecified: Secondary | ICD-10-CM | POA: Diagnosis not present

## 2016-02-14 DIAGNOSIS — R938 Abnormal findings on diagnostic imaging of other specified body structures: Secondary | ICD-10-CM

## 2016-04-05 ENCOUNTER — Other Ambulatory Visit: Payer: Self-pay | Admitting: Family Medicine

## 2016-04-10 ENCOUNTER — Ambulatory Visit (INDEPENDENT_AMBULATORY_CARE_PROVIDER_SITE_OTHER): Payer: Commercial Managed Care - PPO | Admitting: Family Medicine

## 2016-04-10 ENCOUNTER — Encounter: Payer: Self-pay | Admitting: Family Medicine

## 2016-04-10 VITALS — BP 126/69 | HR 78 | Ht 72.0 in | Wt 216.0 lb

## 2016-04-10 DIAGNOSIS — Z0000 Encounter for general adult medical examination without abnormal findings: Secondary | ICD-10-CM | POA: Diagnosis not present

## 2016-04-10 DIAGNOSIS — R7301 Impaired fasting glucose: Secondary | ICD-10-CM

## 2016-04-10 DIAGNOSIS — M1A0710 Idiopathic chronic gout, right ankle and foot, without tophus (tophi): Secondary | ICD-10-CM

## 2016-04-10 DIAGNOSIS — R7989 Other specified abnormal findings of blood chemistry: Secondary | ICD-10-CM | POA: Diagnosis not present

## 2016-04-10 DIAGNOSIS — R945 Abnormal results of liver function studies: Secondary | ICD-10-CM

## 2016-04-10 LAB — POCT GLYCOSYLATED HEMOGLOBIN (HGB A1C): Hemoglobin A1C: 5.9
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
## 2016-04-10 NOTE — Patient Instructions (Signed)
Keep up a regular exercise program and make sure you are eating a healthy diet Try to eat 4 servings of dairy a day, or if you are lactose intolerant take a calcium with vitamin D daily.  Your vaccines are up to date.   

## 2016-04-11 LAB — COMPLETE METABOLIC PANEL WITH GFR
ALBUMIN: 4.5 g/dL (ref 3.6–5.1)
ALK PHOS: 52 U/L (ref 40–115)
ALT: 53 U/L — ABNORMAL HIGH (ref 9–46)
AST: 29 U/L (ref 10–40)
BUN: 16 mg/dL (ref 7–25)
CO2: 20 mmol/L (ref 20–31)
Calcium: 9.8 mg/dL (ref 8.6–10.3)
Chloride: 106 mmol/L (ref 98–110)
Creat: 1.02 mg/dL (ref 0.60–1.35)
GFR, EST NON AFRICAN AMERICAN: 87 mL/min (ref >=60)
Glucose, Bld: 104 mg/dL — ABNORMAL HIGH (ref 65–99)
POTASSIUM: 4.6 mmol/L (ref 3.5–5.3)
Sodium: 144 mmol/L (ref 135–146)
TOTAL PROTEIN: 7.3 g/dL (ref 6.1–8.1)
Total Bilirubin: 0.2 mg/dL (ref 0.2–1.2)

## 2016-04-11 LAB — LIPID PANEL
Cholesterol: 195 mg/dL (ref <200)
HDL: 42 mg/dL (ref >40)
LDL Cholesterol: 126 mg/dL — ABNORMAL HIGH (ref <100)
Total CHOL/HDL Ratio: 4.6 Ratio (ref <5.0)
Triglycerides: 133 mg/dL (ref <150)
VLDL: 27 mg/dL (ref <30)

## 2016-04-11 LAB — URIC ACID: Uric Acid, Serum: 5.4 mg/dL (ref 4.0–8.0)

## 2016-04-11 NOTE — Addendum Note (Signed)
Addended by: Teddy Spike on: 04/11/2016 05:54 PM   Modules accepted: Orders

## 2016-08-15 ENCOUNTER — Other Ambulatory Visit: Payer: Self-pay | Admitting: Family Medicine

## 2016-09-06 ENCOUNTER — Ambulatory Visit (INDEPENDENT_AMBULATORY_CARE_PROVIDER_SITE_OTHER): Payer: Commercial Managed Care - PPO | Admitting: Family Medicine

## 2016-09-06 ENCOUNTER — Encounter: Payer: Self-pay | Admitting: Family Medicine

## 2016-09-06 VITALS — BP 128/78 | HR 78 | Ht 70.47 in | Wt 217.0 lb

## 2016-09-06 DIAGNOSIS — L821 Other seborrheic keratosis: Secondary | ICD-10-CM

## 2016-09-06 DIAGNOSIS — R0683 Snoring: Secondary | ICD-10-CM

## 2016-09-06 DIAGNOSIS — L853 Xerosis cutis: Secondary | ICD-10-CM

## 2016-09-06 DIAGNOSIS — D229 Melanocytic nevi, unspecified: Secondary | ICD-10-CM | POA: Diagnosis not present

## 2016-09-06 DIAGNOSIS — R6884 Jaw pain: Secondary | ICD-10-CM | POA: Diagnosis not present

## 2016-09-06 DIAGNOSIS — R5383 Other fatigue: Secondary | ICD-10-CM | POA: Diagnosis not present

## 2016-09-06 LAB — CBC WITH DIFFERENTIAL/PLATELET
BASOS ABS: 0 {cells}/uL (ref 0–200)
Basophils Relative: 0 %
Eosinophils Absolute: 180 cells/uL (ref 15–500)
Eosinophils Relative: 2 %
HCT: 45.4 % (ref 38.5–50.0)
HEMOGLOBIN: 14.8 g/dL (ref 13.2–17.1)
LYMPHS ABS: 3240 {cells}/uL (ref 850–3900)
LYMPHS PCT: 36 %
MCH: 30.8 pg (ref 27.0–33.0)
MCHC: 32.6 g/dL (ref 32.0–36.0)
MCV: 94.6 fL (ref 80.0–100.0)
MONO ABS: 900 {cells}/uL (ref 200–950)
MPV: 10.3 fL (ref 7.5–12.5)
Monocytes Relative: 10 %
NEUTROS PCT: 52 %
Neutro Abs: 4680 cells/uL (ref 1500–7800)
Platelets: 300 10*3/uL (ref 140–400)
RBC: 4.8 MIL/uL (ref 4.20–5.80)
RDW: 14.3 % (ref 11.0–15.0)
WBC: 9 10*3/uL (ref 3.8–10.8)

## 2016-09-06 LAB — TSH: TSH: 1.68 mIU/L (ref 0.40–4.50)

## 2016-09-06 NOTE — Progress Notes (Signed)
Subjective:    Patient ID: Andre Henderson, male    DOB: 07-Jul-1970, 46 y.o.   MRN: [id number]  HPI 46 year old male comes in today complaining of left jaw pain just in front of the ear. He says it's been going on for couple of weeks. He actually had a root canal done about 3 weeks ago so he is having to chew on the left side of his mouth. He says it almost feels like a tightness or fullness or discomfort but he says it's not really painful.  He also found a new mole on his left side just distal to the axilla. He says it's not bothersome itchy or painful.   he has had some red itchy skin on his upper back but tends to take super hot showers as well.  He also reports that he's been very fatigued lately. He's really try to start eating healthy and was hoping that he would actually feel a little better. He says he does struggle with getting good sleep but then when he does sleep in he still just wakes up feeling exhausted. He denies any morning headaches but does report that he snores regularly.  Review of Systems   BP 128/78   Pulse 78   Ht 5' 10.47" (1.79 m)   Wt 217 lb (98.4 kg)   SpO2 98%   BMI 30.72 kg/m     No Known Allergies  Past Medical History:  Diagnosis Date  . Arthritis   . Crohn disease ([place])   . Gout     Past Surgical History:  Procedure Laterality Date  . ANKLE FRACTURE SURGERY Right     Social History   Social History  . Marital status: Single    Spouse name: [name]  . Number of children: N/A  . 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 Smoking status: Current Every Day Smoker    Types: Cigars  . Smokeless tobacco: Never Used  . Alcohol use 6.0 oz/week    10 Cans of beer per week  . Drug use: No  . Sexual activity: Yes    Partners: Female   Other Topics Concern  . Not on file   Social History Narrative   1-2 sodas per day. Some regular exercise. Does smoke a  cigar from time to time. 10 beers per week. He currently lives with [name] currently works at [employer].      Family History  Problem Relation Age of Onset  . Diabetes Mother   . Hyperlipidemia Mother   . Hypertension Mother   . Diabetes Paternal Uncle     Outpatient Encounter Prescriptions as of 09/06/2016  Medication Sig  . allopurinol (ZYLOPRIM) 300 MG tablet TAKE 1 TABLET(300 MG) BY MOUTH DAILY  . omeprazole (PRILOSEC) 10 MG capsule Take by mouth.   No facility-administered encounter medications on file as of 09/06/2016.           Objective:   Physical Exam  Constitutional: He is oriented to person, place, and time. He appears well-developed and well-nourished.  HENT:  Head: Normocephalic and atraumatic.  Right Ear: External ear normal.  Left Ear: External ear normal.  Nose: Nose normal.  Mouth/Throat: Oropharynx is clear and moist.  TMs and canals are clear.   Eyes: Conjunctivae and EOM are normal. Pupils are equal, round, and reactive to light.  Neck: Neck supple. No thyromegaly present.  Cardiovascular: Normal rate and normal heart sounds.   Pulmonary/Chest: Effort normal and breath sounds normal.  Lymphadenopathy:    He has no cervical adenopathy.  Neurological: He is alert and oriented to person, place, and time.  Skin: Skin is warm and dry.  He has a light brown seborrheic keratosis on the left side of his body. On his mid back he has an atypical nevus that measures approximate 2 x 6 mm just to the left of the mid spine area. On his upper back he has several tattoos but the skin is a little bit erythematous and dry but no particular rash or lesions.  Psychiatric: He has a normal mood and affect.            Assessment & Plan:  left jaw pain-suspect TMJ. Discussed treatment with anti-inflammatory gentle stretches and avoiding excess chewing. If getting worse or not improving over the next 2-3 weeks and please let me know. He will be  getting the final crown on his tooth on the right side in about 2 weeks and at that point he'll be able to chew on the right side again.  Seborrheic keratosis-discussed benign nature of this lesion.  Atypical nevus-he did have a brown mole on his mid back area that I did take a photograph of today. It measures approximately 2 x 6 mm in size we will keep an eye on this.  Dry skin-encouraged him to not take his hot showers which removed the natural oils from the skin and to moisturize well.  Snoring- stopping questionnaire score of 4. Thus he does screen positive for sleep apnea. Discussed option of getting a formal sleep study for further evaluation. Refer to start with a home sleep study.

## 2016-09-07 LAB — FERRITIN: Ferritin: 238 ng/mL (ref 20–380)

## 2016-09-07 LAB — COMPLETE METABOLIC PANEL WITH GFR
ALT: 78 U/L — AB (ref 9–46)
AST: 49 U/L — ABNORMAL HIGH (ref 10–40)
Albumin: 4.2 g/dL (ref 3.6–5.1)
Alkaline Phosphatase: 52 U/L (ref 40–115)
BUN: 14 mg/dL (ref 7–25)
CALCIUM: 9.5 mg/dL (ref 8.6–10.3)
CO2: 26 mmol/L (ref 20–31)
CREATININE: 0.98 mg/dL (ref 0.60–1.35)
Chloride: 106 mmol/L (ref 98–110)
GFR, Est African American: >89 mL/min (ref >=60)
Glucose, Bld: 100 mg/dL — ABNORMAL HIGH (ref 65–99)
Potassium: 4.7 mmol/L (ref 3.5–5.3)
Sodium: 141 mmol/L (ref 135–146)
Total Bilirubin: 0.3 mg/dL (ref 0.2–1.2)
Total Protein: 6.7 g/dL (ref 6.1–8.1)

## 2016-09-07 LAB — VITAMIN B12: Vitamin B-12: 358 pg/mL (ref 200–1100)

## 2016-11-11 ENCOUNTER — Other Ambulatory Visit: Payer: Self-pay | Admitting: Family Medicine

## 2017-03-21 ENCOUNTER — Encounter: Payer: Commercial Managed Care - PPO | Admitting: Family Medicine

## 2017-04-01 ENCOUNTER — Other Ambulatory Visit: Payer: Self-pay | Admitting: Family Medicine

## 2017-04-25 ENCOUNTER — Encounter: Payer: Self-pay | Admitting: Family Medicine

## 2017-04-25 ENCOUNTER — Ambulatory Visit (INDEPENDENT_AMBULATORY_CARE_PROVIDER_SITE_OTHER): Payer: 59 | Admitting: Family Medicine

## 2017-04-25 VITALS — BP 127/74 | HR 78 | Ht 70.0 in | Wt 214.0 lb

## 2017-04-25 DIAGNOSIS — R7301 Impaired fasting glucose: Secondary | ICD-10-CM | POA: Diagnosis not present

## 2017-04-25 DIAGNOSIS — M1A0710 Idiopathic chronic gout, right ankle and foot, without tophus (tophi): Secondary | ICD-10-CM | POA: Diagnosis not present

## 2017-04-25 DIAGNOSIS — Z0000 Encounter for general adult medical examination without abnormal findings: Secondary | ICD-10-CM | POA: Diagnosis not present

## 2017-04-25 DIAGNOSIS — F430 Acute stress reaction: Secondary | ICD-10-CM

## 2017-04-25 DIAGNOSIS — L732 Hidradenitis suppurativa: Secondary | ICD-10-CM | POA: Diagnosis not present

## 2017-04-25 MED ORDER — DOXYCYCLINE HYCLATE 100 MG PO TABS: 100.0000 mg | ORAL_TABLET | Freq: Two times a day (BID) | ORAL | 0 refills | Status: DC

## 2017-04-25 NOTE — Patient Instructions (Addendum)

## 2017-04-25 NOTE — Progress Notes (Signed)
Subjective:    Patient ID: Andre Henderson, male    DOB: 01-16-1971, 47 y.o.   MRN: [id number]  HPI 47 year old male is here today for complete physical exam. He is doing well overall.   He does report that he has had increased stress particularly at work.  Says in fact over the last 6 months he is been more stress than he has in his entire life.  He took a new position at work and now he is really doing 3 different jobs.  Been working long hours and so has not been able to work out as regularly.  He is also been having a sensation in the lower part of his chest just at the middle of the stomach which she says feels like "when you have a panic attack" but just at the mid upper epigstric area.  He denies any pain or pressure.  He says is not triggered by eating or not eating.  It seems to occur randomly.  Soon-to-be to look at some recurrent cyst that he gets below the axilla bilaterally.  He says sometimes they are very tender.  He says he tries not to pick at them sometimes they do drain on their own and the symptoms eventually just go back down.  The ones on the right side are particularly irritated and tender right now.  He rarely smokes, maybe one cigar per month.  He was significantly cut down on the [facility].  He said in fact he actually cut completely out of his diet for about 2 months but then started back but not nearly what he was consuming previously.  He does try to eat healthy overall and has been trying to do some cardio exercise he just has not been as consistent as he would like.  He has noticed that his vision has gotten worse over the last year but he is due for an eye appointment.  Last eye exam was about 2 years ago.   Review of Systems  BP 127/74   Pulse 78   Ht 5\' 10"  (1.778 m)   Wt 214 lb (97.1 kg)   SpO2 98%   BMI 30.71 kg/m     No Known Allergies  Past Medical History:  Diagnosis Date  . Arthritis   . Crohn disease ([place])   . Gout     Past Surgical History:   Procedure Laterality Date  . ANKLE FRACTURE SURGERY Right     Social History   Socioeconomic History  . Marital status: Single    Spouse name: [name]  . Number of children: Not on file  . Years of education: Not on file  . Highest education level: Not on file  Social Needs  . Financial resource strain: Not on file  . Food insecurity - worry: Not on file  . Food insecurity - inability: Not on file  . Transportation needs - medical: Not on file  . Transportation needs - non-medical: Not on file  Occupational History  . Occupation: Careers adviser: [employer]  Tobacco Use  . Smoking status: Light Tobacco Smoker    Packs/day: 0.00    Types: Cigars  . Smokeless tobacco: Never Used  . Tobacco comment: On cigar per month.   Substance and Sexual Activity  . Alcohol use: Yes    Alcohol/week: 6.0 oz    Types: 10 Cans of beer per week  . Drug use: No  . Sexual activity: Yes  Partners: Female  Other Topics Concern  . Not on file  Social History Narrative   1-2 sodas per day. Some regular exercise. Does smoke a cigar from time to time. 10 beers per week. He currently lives with [name] currently works at [employer].      Family History  Problem Relation Age of Onset  . Diabetes Mother   . Hyperlipidemia Mother   . Hypertension Mother   . Diabetes Paternal Uncle     Outpatient Encounter Medications as of 04/25/2017  Medication Sig  . allopurinol (ZYLOPRIM) 300 MG tablet TAKE ONE TABLET BY MOUTH ONE TIME DAILY  . omeprazole (PRILOSEC) 10 MG capsule Take by mouth.   No facility-administered encounter medications on file as of 04/25/2017.          Objective:   Physical Exam  Constitutional: He is oriented to person, place, and time. He appears well-developed and well-nourished.  HENT:  Head: Normocephalic and atraumatic.  Right Ear: External ear normal.  Left Ear: External ear normal.  Nose: Nose normal.   Mouth/Throat: Oropharynx is clear and moist.  Eyes: Conjunctivae and EOM are normal. Pupils are equal, round, and reactive to light.  Neck: Normal range of motion. Neck supple. No thyromegaly present.  Cardiovascular: Normal rate, regular rhythm, normal heart sounds and intact distal pulses.  Pulmonary/Chest: Effort normal and breath sounds normal.  Abdominal: Soft. Bowel sounds are normal. He exhibits no distension and no mass. There is no tenderness. There is no rebound and no guarding.  Musculoskeletal: Normal range of motion.  Lymphadenopathy:    He has no cervical adenopathy.  Neurological: He is alert and oriented to person, place, and time. He has normal reflexes.  Skin: Skin is warm and dry.  Psychiatric: He has a normal mood and affect. His behavior is normal. Judgment and thought content normal.          Assessment & Plan:  CPE Keep up a regular exercise program and make sure you are eating a healthy diet Try to eat 4 servings of dairy a day, or if you are lactose intolerant take a calcium with vitamin D daily.  Your vaccines are up to date.   Vision change-did encourage him to get back in with his eye doctor this year.  IFG - due for eye exam.    Acute stress -his employers really set some limits around the amount of work that he is doing since it sounds like he is actually working 3 different jobs at his current site.  Hidradenitis suppurativa  - recurrent cyst just below the axilla bilaterally.  We discussed current treatment with doxycycline for the cyst under the right side which are erythematous and tender today.  More long-term recommend trial of acne wash.  We can even consider treating with a topical benzyl peroxide or even a topical erythromycin gel if he would like.  For now he just wants to try the doxycycline to clear up the current inflammation.  Gout  - for repeat uric acid.

## 2017-05-08 DIAGNOSIS — Z0000 Encounter for general adult medical examination without abnormal findings: Secondary | ICD-10-CM | POA: Diagnosis not present

## 2017-05-08 DIAGNOSIS — R7301 Impaired fasting glucose: Secondary | ICD-10-CM | POA: Diagnosis not present

## 2017-05-08 DIAGNOSIS — M1A0710 Idiopathic chronic gout, right ankle and foot, without tophus (tophi): Secondary | ICD-10-CM | POA: Diagnosis not present

## 2017-05-09 ENCOUNTER — Other Ambulatory Visit: Payer: Self-pay

## 2017-05-09 DIAGNOSIS — R748 Abnormal levels of other serum enzymes: Secondary | ICD-10-CM

## 2017-05-09 LAB — URIC ACID: URIC ACID, SERUM: 4.4 mg/dL (ref 4.0–8.0)

## 2017-05-09 LAB — CBC WITH DIFFERENTIAL/PLATELET
BASOS PCT: 0.7 %
Basophils Absolute: 60 cells/uL (ref 0–200)
EOS PCT: 1.6 %
Eosinophils Absolute: 138 cells/uL (ref 15–500)
HCT: 44.2 % (ref 38.5–50.0)
HEMOGLOBIN: 15.3 g/dL (ref 13.2–17.1)
Lymphs Abs: 2795 cells/uL (ref 850–3900)
MCH: 31.5 pg (ref 27.0–33.0)
MCHC: 34.6 g/dL (ref 32.0–36.0)
MCV: 91.1 fL (ref 80.0–100.0)
MONOS PCT: 12.4 %
MPV: 10.6 fL (ref 7.5–12.5)
NEUTROS ABS: 4541 {cells}/uL (ref 1500–7800)
Neutrophils Relative %: 52.8 %
Platelets: 321 10*3/uL (ref 140–400)
RBC: 4.85 10*6/uL (ref 4.20–5.80)
RDW: 12.9 % (ref 11.0–15.0)
Total Lymphocyte: 32.5 %
WBC mixed population: 1066 cells/uL — ABNORMAL HIGH (ref 200–950)
WBC: 8.6 10*3/uL (ref 3.8–10.8)

## 2017-05-09 LAB — HEMOGLOBIN A1C
EAG (MMOL/L): 7.1 (calc)
Hgb A1c MFr Bld: 6.1 % of total Hgb — ABNORMAL HIGH (ref <5.7)
Mean Plasma Glucose: 128 (calc)

## 2017-05-09 LAB — COMPLETE METABOLIC PANEL WITH GFR
AG RATIO: 1.7 (calc) (ref 1.0–2.5)
ALBUMIN MSPROF: 4.6 g/dL (ref 3.6–5.1)
ALKALINE PHOSPHATASE (APISO): 59 U/L (ref 40–115)
ALT: 82 U/L — ABNORMAL HIGH (ref 9–46)
AST: 45 U/L — ABNORMAL HIGH (ref 10–40)
BUN: 14 mg/dL (ref 7–25)
CALCIUM: 10.3 mg/dL (ref 8.6–10.3)
CO2: 26 mmol/L (ref 20–32)
CREATININE: 0.97 mg/dL (ref 0.60–1.35)
Chloride: 104 mmol/L (ref 98–110)
GFR, EST NON AFRICAN AMERICAN: 92 mL/min/{1.73_m2} (ref >=60)
GFR, Est African American: 107 mL/min/{1.73_m2} (ref >=60)
GLOBULIN: 2.7 g/dL (ref 1.9–3.7)
Glucose, Bld: 114 mg/dL — ABNORMAL HIGH (ref 65–99)
Potassium: 4.6 mmol/L (ref 3.5–5.3)
SODIUM: 140 mmol/L (ref 135–146)
Total Bilirubin: 0.4 mg/dL (ref 0.2–1.2)
Total Protein: 7.3 g/dL (ref 6.1–8.1)

## 2017-05-09 LAB — LIPID PANEL W/REFLEX DIRECT LDL
CHOL/HDL RATIO: 4.9 (calc) (ref <5.0)
Cholesterol: 209 mg/dL — ABNORMAL HIGH (ref <200)
HDL: 43 mg/dL (ref >40)
LDL Cholesterol (Calc): 132 mg/dL (calc) — ABNORMAL HIGH
NON-HDL CHOLESTEROL (CALC): 166 mg/dL — AB (ref <130)
Triglycerides: 195 mg/dL — ABNORMAL HIGH (ref <150)

## 2017-05-14 ENCOUNTER — Encounter: Payer: Self-pay | Admitting: Family Medicine

## 2017-05-16 ENCOUNTER — Ambulatory Visit (INDEPENDENT_AMBULATORY_CARE_PROVIDER_SITE_OTHER): Payer: 59

## 2017-05-16 ENCOUNTER — Encounter: Payer: Self-pay | Admitting: Family Medicine

## 2017-05-16 DIAGNOSIS — R748 Abnormal levels of other serum enzymes: Secondary | ICD-10-CM

## 2017-05-16 DIAGNOSIS — K760 Fatty (change of) liver, not elsewhere classified: Secondary | ICD-10-CM

## 2017-06-09 ENCOUNTER — Other Ambulatory Visit: Payer: Self-pay | Admitting: Family Medicine

## 2017-09-29 ENCOUNTER — Encounter: Payer: Self-pay | Admitting: Emergency Medicine

## 2017-09-29 ENCOUNTER — Emergency Department
Admission: EM | Admit: 2017-09-29 | Discharge: 2017-09-29 | Disposition: A | Discharge status: Home / Self Care | Payer: 59 | Source: Home / Self Care

## 2017-09-29 DIAGNOSIS — N492 Inflammatory disorders of scrotum: Secondary | ICD-10-CM | POA: Diagnosis not present

## 2017-09-29 MED ORDER — DOXYCYCLINE HYCLATE 100 MG PO CAPS: 100.0000 mg | ORAL_CAPSULE | Freq: Two times a day (BID) | ORAL | 0 refills | Status: DC

## 2017-09-29 NOTE — ED Triage Notes (Signed)
Pt states he noticed a large red bump on his testicle yesterday. States only mild discomfort. Denies concerns for std.

## 2017-09-29 NOTE — Discharge Instructions (Addendum)
Take doxycycline 1 twice daily for infection  Soak in a warm tub a couple of times a day for a few days until this is healing up.  Wait until tomorrow to begin.  Ibuprofen 200 mg 3 tablets 3 times daily as needed for pain  Return as needed  If the place on the back of the thigh is growing get rechecked.

## 2017-09-29 NOTE — ED Provider Notes (Signed)
[name]    CSN: [id number] Arrival date & time: 09/29/17  1648     History   Chief Complaint Chief Complaint  Patient presents with  . Rash    HPI Andre Henderson is a 47 y.o. male.   HPI Patient is here with an abscess on his left side of his scrotum.  He noticed a bump there the other day and it is getting much bigger.  He also has a little place in the back of his left thigh he wanted me to look at.  He has had various boils came and went before.  He says that usually draining go away. Past Medical History:  Diagnosis Date  . Arthritis   . Crohn disease ([place])   . Gout     Patient Active Problem List   Diagnosis Date Noted  . Fatty liver 05/16/2017  . IFG (impaired fasting glucose) 11/19/2013  . Gout 10/15/2013  . GERD (gastroesophageal reflux disease) 10/15/2013  . Tobacco abuse 10/15/2013  . 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 ANKLE FRACTURE SURGERY Right        Home Medications    Prior to Admission medications   Medication Sig Start Date End Date Taking? Authorizing Provider  allopurinol (ZYLOPRIM) 300 MG tablet TAKE ONE TABLET BY MOUTH ONE TIME DAILY 06/09/17   Hali Marry, MD  doxycycline (VIBRA-TABS) 100 MG tablet Take 1 tablet (100 mg total) by mouth 2 (two) times daily. 04/25/17   Hali Marry, MD  omeprazole (PRILOSEC) 10 MG capsule Take by mouth.    [provider]    Family History Family History  Problem Relation Age of Onset  . Diabetes Mother   . Hyperlipidemia Mother   . Hypertension Mother   . Diabetes Paternal Uncle     Social History Social History   Tobacco Use  . Smoking status: Light Tobacco Smoker    Packs/day: 0.00    Types: Cigars  . Smokeless tobacco: Never Used  . Tobacco comment: One cigar per month.   Substance Use Topics  . Alcohol use: Yes    Alcohol/week: 6.0 oz    Types: 10 Cans of beer  per week  . Drug use: No     Allergies   Patient has no known allergies.   Review of Systems Review of Systems Not diabetic.  No acute complaints.  Physical Exam Triage Vital Signs ED Triage Vitals [09/29/17 1707]  Enc Vitals Group     BP 133/83     Pulse Rate 78     Resp      Temp 98.7 F (37.1 C)     Temp Source Oral     SpO2 98 %     Weight 212 lb (96.2 kg)     Height      Head Circumference      Peak Flow      Pain Score 0     Pain Loc      Pain Edu?      Excl. in [place]?    No data found.  Updated Vital Signs BP 133/83 (BP Location: Right Arm)   Pulse 78   Temp 98.7 F (37.1 C) (Oral)   Wt 212 lb (96.2 kg)   SpO2 98%   BMI 30.42 kg/m   Visual Acuity Right Eye Distance:   Left Eye Distance:   Bilateral Distance:    Right  Eye Near:   Left Eye Near:    Bilateral Near:     Physical Exam On the left side of the scrotum, toward the posterior aspect of the sac, is a 1.5 cm cystic area that is coming to ahead but not yet draining.  On the back of his thigh is a four 5 mm diameter from little nodule with a tiny blackhead center to it.  UC Treatments / Results  Labs (all labs ordered are listed, but only abnormal results are displayed) Labs Reviewed - No data to display  EKG None  Radiology No results found.  Procedures Procedures (including critical care time) The patient gave consent for me to numb him with lidocaine 1% epi.  This was done without difficulty with normal prep.  Simple I&D was performed and the pus expressed.  Due to its location I decided not to pack wound since I did not think it would stay and such a small sac.  He was instructed in its care. Medications Ordered in UC Medications - No data to display  Initial Impression / Assessment and Plan / UC Course  I have reviewed the triage vital signs and the nursing notes.  Pertinent labs & imaging results that were available during my care of the patient were reviewed by me and  considered in my medical decision making (see chart for details).     Cystic abscess left side of scrotum, I & D performed. Small sebaceous cyst on back of thigh, return if it gets problems. Final Clinical Impressions(s) / UC Diagnoses   Final diagnoses:  None   Discharge Instructions   None    ED Prescriptions    None     Controlled Substance Prescriptions [place] Controlled Substance Registry consulted? No   Posey Boyer, MD 09/29/17 1820

## 2017-10-02 ENCOUNTER — Telehealth: Payer: Self-pay

## 2017-10-02 ENCOUNTER — Other Ambulatory Visit: Payer: Self-pay | Admitting: Family Medicine

## 2017-10-02 LAB — WOUND CULTURE
MICRO NUMBER: 90781643
SPECIMEN QUALITY: ADEQUATE

## 2017-10-02 NOTE — Telephone Encounter (Signed)
Left message with lab results and call back information given.

## 2017-10-10 ENCOUNTER — Ambulatory Visit: Payer: 59 | Admitting: Family Medicine

## 2017-10-20 ENCOUNTER — Encounter: Payer: Self-pay | Admitting: Family Medicine

## 2017-10-20 ENCOUNTER — Ambulatory Visit: Payer: 59 | Admitting: Family Medicine

## 2017-10-20 VITALS — BP 123/73 | HR 72 | Ht 70.0 in | Wt 214.0 lb

## 2017-10-20 DIAGNOSIS — R7301 Impaired fasting glucose: Secondary | ICD-10-CM

## 2017-10-20 DIAGNOSIS — K760 Fatty (change of) liver, not elsewhere classified: Secondary | ICD-10-CM

## 2017-10-20 DIAGNOSIS — K219 Gastro-esophageal reflux disease without esophagitis: Secondary | ICD-10-CM

## 2017-10-20 DIAGNOSIS — M1A0710 Idiopathic chronic gout, right ankle and foot, without tophus (tophi): Secondary | ICD-10-CM | POA: Diagnosis not present

## 2017-10-20 LAB — POCT GLYCOSYLATED HEMOGLOBIN (HGB A1C): Hemoglobin A1C: 6.2 % — AB (ref 4.0–5.6)

## 2017-10-20 NOTE — Progress Notes (Signed)
Subjective:    CC: glucose, gout, and liver  HPI:  Impaired fasting glucose-no increased thirst or urination. No symptoms consistent with hypoglycemia.  He is worked hard to cut out sugary foods and drinks but says he still does not really portion control on carbs.  He admits he really overeats too much.  Gout - no recent flares. He takes his allopurinol daily.  Have some stiffness in his right pinky finger a week or so ago but it actually has gotten better.  Fatty liver -he does still occasionally drink beer but says he is cut back.  He does avoid Tylenol. GERD - he takes his PPI daily.    Past medical history, Surgical history, Family history not pertinant except as noted below, Social history, Allergies, and medications have been entered into the medical record, reviewed, and corrections made.   Review of Systems: No fevers, chills, night sweats, weight loss, chest pain, or shortness of breath.   Objective:    General: Well Developed, well nourished, and in no acute distress.  Neuro: Alert and oriented x3, extra-ocular muscles intact, sensation grossly intact.  HEENT: Normocephalic, atraumatic  Skin: Warm and dry, no rashes. Cardiac: Regular rate and rhythm, no murmurs rubs or gallops, no lower extremity edema.  Respiratory: Clear to auscultation bilaterally. Not using accessory muscles, speaking in full sentences.   Impression and Recommendations:    IFG - up a little from previous. REcheck in 6 months.    Gout - stable.  Check uric acid today.    Fatty liver -due to recheck liver enzymes.  Continue to minimize alcohol use and avoid Tylenol use.  GERD -we discussed dietary changes and trying to reduce his PPI to every other day.  Warned about potential for long-term risks with use of the PPI.

## 2017-10-27 ENCOUNTER — Emergency Department
Admission: EM | Admit: 2017-10-27 | Discharge: 2017-10-27 | Disposition: A | Discharge status: Home / Self Care | Payer: 59 | Source: Home / Self Care

## 2017-10-27 ENCOUNTER — Other Ambulatory Visit: Payer: Self-pay

## 2017-10-27 DIAGNOSIS — L02416 Cutaneous abscess of left lower limb: Secondary | ICD-10-CM

## 2017-10-27 MED ORDER — SULFAMETHOXAZOLE-TRIMETHOPRIM 800-160 MG PO TABS: 1.0000 | ORAL_TABLET | Freq: Two times a day (BID) | ORAL | 0 refills | Status: AC

## 2017-10-27 NOTE — Discharge Instructions (Signed)
Return if any problems.

## 2017-10-27 NOTE — ED Triage Notes (Signed)
Pt has abscess on upper left thigh

## 2017-10-27 NOTE — ED Provider Notes (Signed)
[facility]    CSN: [id number] Arrival date & time: 10/27/17  1529     History   Chief Complaint Chief Complaint  Patient presents with  . Abscess    HPI Andre Henderson is a 47 y.o. male.   The history is provided by the patient. No language interpreter was used.  Abscess  Location:  Leg Leg abscess location:  L upper leg Size:  2 Abscess quality: painful, redness and warmth   Red streaking: no   Duration:  2 days Progression:  Worsening Pain details:    Quality:  Aching   Severity:  Moderate   Timing:  Constant Chronicity:  New Context: not diabetes   Relieved by:  Nothing Worsened by:  Nothing Associated symptoms: no fever     Past Medical History:  Diagnosis Date  . Arthritis   . Crohn disease ([place])   . Gout     Patient Active Problem List   Diagnosis Date Noted  . Gastroesophageal reflux disease without esophagitis 10/20/2017  . Fatty liver 05/16/2017  . IFG (impaired fasting glucose) 11/19/2013  . Gout 10/15/2013  . GERD (gastroesophageal reflux disease) 10/15/2013  . Tobacco abuse 10/15/2013  . 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 ANKLE FRACTURE SURGERY Right        Home Medications    Prior to Admission medications   Medication Sig Start Date End Date Taking? Authorizing Provider  allopurinol (ZYLOPRIM) 300 MG tablet TAKE ONE TABLET BY MOUTH ONE TIME DAILY 10/03/17   Hali Marry, MD  omeprazole (PRILOSEC) 10 MG capsule Take by mouth.    [provider]  sulfamethoxazole-trimethoprim (BACTRIM DS,SEPTRA DS) 800-160 MG tablet Take 1 tablet by mouth 2 (two) times daily for 7 days. 10/27/17 11/03/17  Fransico Meadow, PA-C    Family History Family History  Problem Relation Age of Onset  . Diabetes Mother   . Hyperlipidemia Mother   . Hypertension Mother   . Diabetes Paternal Uncle     Social History Social History   Tobacco Use    . Smoking status: Light Tobacco Smoker    Packs/day: 0.00    Types: Cigars  . Smokeless tobacco: Never Used  . Tobacco comment: One cigar per month.   Substance Use Topics  . Alcohol use: Yes    Alcohol/week: 6.0 oz    Types: 10 Cans of beer per week  . Drug use: No     Allergies   Patient has no known allergies.   Review of Systems Review of Systems  Constitutional: Negative for fever.  All other systems reviewed and are negative.    Physical Exam Triage Vital Signs ED Triage Vitals  Enc Vitals Group     BP      Pulse      Resp      Temp      Temp src      SpO2      Weight      Height      Head Circumference      Peak Flow      Pain Score      Pain Loc      Pain Edu?      Excl. in [place]?    No data found.  Updated Vital Signs There were no vitals taken for this visit.  Visual Acuity Right Eye Distance:   Left Eye Distance:  Bilateral Distance:    Right Eye Near:   Left Eye Near:    Bilateral Near:     Physical Exam  Constitutional: He is oriented to person, place, and time. He appears well-developed and well-nourished.  HENT:  Head: Normocephalic and atraumatic.  Eyes: Conjunctivae and EOM are normal.  Neck: Normal range of motion. Neck supple.  Cardiovascular: Normal rate.  No murmur heard. Pulmonary/Chest: Effort normal. No respiratory distress.  Musculoskeletal: He exhibits no edema.  2cm abscess posterior left leg  Neurological: He is alert and oriented to person, place, and time.  Skin: Skin is warm and dry.  Psychiatric: He has a normal mood and affect.  Nursing note and vitals reviewed.    UC Treatments / Results  Labs (all labs ordered are listed, but only abnormal results are displayed) Labs Reviewed - No data to display  EKG None  Radiology No results found.  Procedures Incision and Drainage Date/Time: 10/28/2017 8:08 AM Performed by: Fransico Meadow, PA-C Authorized by: Fransico Meadow, PA-C   Consent:    Consent  obtained:  Verbal   Consent given by:  Patient   Risks discussed:  Bleeding, incomplete drainage, pain and damage to other organs   Alternatives discussed:  No treatment Universal protocol:    Procedure explained and questions answered to patient or proxy's satisfaction: yes     Relevant documents present and verified: yes     Test results available and properly labeled: yes     Imaging studies available: yes     Required blood products, implants, devices, and special equipment available: yes     Site/side marked: yes     Immediately prior to procedure a time out was called: yes     Patient identity confirmed:  Verbally with patient Location:    Type:  Abscess Pre-procedure details:    Skin preparation:  Betadine Anesthesia (see MAR for exact dosages):    Anesthesia method:  Local infiltration   Local anesthetic:  Lidocaine 1% WITH epi and lidocaine 1% w/o epi Procedure type:    Complexity:  Simple Procedure details:    Incision types:  Single straight   Incision depth:  Subcutaneous   Scalpel blade:  11   Wound management:  Probed and deloculated, irrigated with saline and extensive cleaning   Drainage:  Purulent   Drainage amount:  Moderate Post-procedure details:    Patient tolerance of procedure:  Tolerated well, no immediate complications   (including critical care time)  Medications Ordered in UC Medications - No data to display  Initial Impression / Assessment and Plan / UC Course  I have reviewed the triage vital signs and the nursing notes.  Pertinent labs & imaging results that were available during my care of the patient were reviewed by me and considered in my medical decision making (see chart for details).      Final Clinical Impressions(s) / UC Diagnoses   Final diagnoses:  Abscess of left thigh     Discharge Instructions     Return if any problems.     ED Prescriptions    Medication Sig Dispense Auth. Provider   sulfamethoxazole-trimethoprim  (BACTRIM DS,SEPTRA DS) 800-160 MG tablet Take 1 tablet by mouth 2 (two) times daily for 7 days. 14 tablet Fransico Meadow, [place]     Controlled Substance Prescriptions [place] Controlled Substance Registry consulted? Not Applicable  An After Visit Summary was printed and given to the patient.    Fransico Meadow, [place] 10/28/17  0809  

## 2017-10-28 DIAGNOSIS — L02416 Cutaneous abscess of left lower limb: Secondary | ICD-10-CM | POA: Diagnosis not present

## 2017-12-19 ENCOUNTER — Other Ambulatory Visit: Payer: Self-pay | Admitting: Family Medicine

## 2018-04-01 HISTORY — PX: COLONOSCOPY: SHX174

## 2018-04-20 ENCOUNTER — Ambulatory Visit (INDEPENDENT_AMBULATORY_CARE_PROVIDER_SITE_OTHER): Payer: 59 | Admitting: Family Medicine

## 2018-04-20 ENCOUNTER — Encounter: Payer: Self-pay | Admitting: Family Medicine

## 2018-04-20 VITALS — BP 117/73 | HR 70 | Ht 70.0 in | Wt 218.0 lb

## 2018-04-20 DIAGNOSIS — R0683 Snoring: Secondary | ICD-10-CM

## 2018-04-20 DIAGNOSIS — R7301 Impaired fasting glucose: Secondary | ICD-10-CM | POA: Diagnosis not present

## 2018-04-20 DIAGNOSIS — M1A0710 Idiopathic chronic gout, right ankle and foot, without tophus (tophi): Secondary | ICD-10-CM | POA: Diagnosis not present

## 2018-04-20 DIAGNOSIS — K760 Fatty (change of) liver, not elsewhere classified: Secondary | ICD-10-CM

## 2018-04-20 DIAGNOSIS — N529 Male erectile dysfunction, unspecified: Secondary | ICD-10-CM

## 2018-04-20 DIAGNOSIS — R5383 Other fatigue: Secondary | ICD-10-CM | POA: Diagnosis not present

## 2018-04-20 LAB — POCT GLYCOSYLATED HEMOGLOBIN (HGB A1C): Hemoglobin A1C: 6.5 % — AB (ref 4.0–5.6)

## 2018-04-20 MED ORDER — METFORMIN HCL ER 500 MG PO TB24: 500.0000 mg | ORAL_TABLET | Freq: Every day | ORAL | 2 refills | Status: DC

## 2018-04-20 MED ORDER — TADALAFIL 20 MG PO TABS: 10.0000 mg | ORAL_TABLET | ORAL | 11 refills | Status: DC | PRN

## 2018-04-20 NOTE — Progress Notes (Addendum)
Subjective:    CC: Fatty liver  HPI:  F/U Fatty liver -no recent changes.  He has really cut back on sugar but says he still eats a lot of carbs.  He is active but no regular exercise. ' Lab Results  Component Value Date   ALT 82 (H) 05/08/2017   AST 45 (H) 05/08/2017   ALKPHOS 52 09/06/2016   BILITOT 0.4 05/08/2017    Impaired fasting glucose-no increased thirst or urination. No symptoms consistent with hypoglycemia.  He is really cut back on sugar but admits he still eats a pretty high carb diet.  He has been noticing some tingling in his feet.    F/U Gout -he is actually done really well.  He says he started get a little discomfort and no just take 2 ibuprofen and that seems to knock it out.  He is not had any big flares with his gout since being on the allopurinol and says it is been really helpful for him.  He also wanted to discuss that he is been having problems with erectile dysfunction and particularly over the last 6 months.  He has had a lot of stressors on him.  He is having a hard time getting an erection and maintaining an erection is getting to the point where it is difficult to have intercourse.  He denies any prior history of heart disease or peripheral vascular disease.  No prior history of hypertension.  Not on any medications that should be causing this.  Still reports extreme fatigue during the daytime.  He says that his wife does report that he snores.  He is not sure if he is had any actual apneic events.  Past medical history, Surgical history, Family history not pertinant except as noted below, Social history, Allergies, and medications have been entered into the medical record, reviewed, and corrections made.   Review of Systems: No fevers, chills, night sweats, weight loss, chest pain, or shortness of breath.   Objective:    General: Well Developed, well nourished, and in no acute distress.  Neuro: Alert and oriented x3, extra-ocular muscles intact, sensation  grossly intact.  HEENT: Normocephalic, atraumatic  Skin: Warm and dry, no rashes. Cardiac: Regular rate and rhythm, no murmurs rubs or gallops, no lower extremity edema.  Respiratory: Clear to auscultation bilaterally. Not using accessory muscles, speaking in full sentences.   Impression and Recommendations:    FAtty liver -recheck liver enzymes.  IFG -hemoglobin A1c of 6.5 today.  Explained to him that this technically puts him right at the prediabetes line.  We discussed options including starting metformin and really working on cutting out carbs and increasing activity level.  Both of his parents are diabetic.  Gout -it sounds like his symptoms are really well controlled.  Due to recheck uric acid level.  Erectile dysfunction-ED questionnaire score of 6.  Answer yes to 5 out of 10 questions on the testosterone.  Gust options.  Will check a testosterone level though he feels like his libido is actually normal.  We discussed ED medications as an option as well.  Could be psychological but could also be related to peripheral vascular disease.  Questionnaire as well.  Fatigue/snoring-stop bang questionnaire score of 4.  Consider sleep study for further work-up.

## 2018-04-21 LAB — CBC WITH DIFFERENTIAL/PLATELET
Absolute Monocytes: 888 cells/uL (ref 200–950)
BASOS PCT: 0.6 %
Basophils Absolute: 48 cells/uL (ref 0–200)
Eosinophils Absolute: 208 cells/uL (ref 15–500)
Eosinophils Relative: 2.6 %
HCT: 44.7 % (ref 38.5–50.0)
Hemoglobin: 15.4 g/dL (ref 13.2–17.1)
Lymphs Abs: 3184 cells/uL (ref 850–3900)
MCH: 31.4 pg (ref 27.0–33.0)
MCHC: 34.5 g/dL (ref 32.0–36.0)
MCV: 91 fL (ref 80.0–100.0)
MONOS PCT: 11.1 %
MPV: 10.7 fL (ref 7.5–12.5)
NEUTROS PCT: 45.9 %
Neutro Abs: 3672 cells/uL (ref 1500–7800)
PLATELETS: 321 10*3/uL (ref 140–400)
RBC: 4.91 10*6/uL (ref 4.20–5.80)
RDW: 12.9 % (ref 11.0–15.0)
TOTAL LYMPHOCYTE: 39.8 %
WBC: 8 10*3/uL (ref 3.8–10.8)

## 2018-04-21 LAB — LIPID PANEL
Cholesterol: 196 mg/dL (ref <200)
HDL: 47 mg/dL (ref >40)
LDL Cholesterol (Calc): 123 mg/dL (calc) — ABNORMAL HIGH
Non-HDL Cholesterol (Calc): 149 mg/dL (calc) — ABNORMAL HIGH (ref <130)
Total CHOL/HDL Ratio: 4.2 (calc) (ref <5.0)
Triglycerides: 148 mg/dL (ref <150)

## 2018-04-21 LAB — COMPLETE METABOLIC PANEL WITH GFR
AG RATIO: 1.8 (calc) (ref 1.0–2.5)
ALBUMIN MSPROF: 4.5 g/dL (ref 3.6–5.1)
ALT: 42 U/L (ref 9–46)
AST: 23 U/L (ref 10–40)
Alkaline phosphatase (APISO): 53 U/L (ref 40–115)
BILIRUBIN TOTAL: 0.3 mg/dL (ref 0.2–1.2)
BUN: 16 mg/dL (ref 7–25)
CHLORIDE: 104 mmol/L (ref 98–110)
CO2: 30 mmol/L (ref 20–32)
Calcium: 10.2 mg/dL (ref 8.6–10.3)
Creat: 0.96 mg/dL (ref 0.60–1.35)
GFR, EST AFRICAN AMERICAN: 107 mL/min/{1.73_m2} (ref >=60)
GFR, Est Non African American: 92 mL/min/{1.73_m2} (ref >=60)
Globulin: 2.5 g/dL (calc) (ref 1.9–3.7)
Glucose, Bld: 105 mg/dL — ABNORMAL HIGH (ref 65–99)
POTASSIUM: 4.9 mmol/L (ref 3.5–5.3)
SODIUM: 141 mmol/L (ref 135–146)
TOTAL PROTEIN: 7 g/dL (ref 6.1–8.1)

## 2018-04-21 LAB — TSH: TSH: 1.06 m[IU]/L (ref 0.40–4.50)

## 2018-04-21 LAB — URIC ACID: URIC ACID, SERUM: 5.4 mg/dL (ref 4.0–8.0)

## 2018-05-10 ENCOUNTER — Other Ambulatory Visit: Payer: Self-pay | Admitting: Family Medicine

## 2018-06-12 ENCOUNTER — Emergency Department
Admission: EM | Admit: 2018-06-12 | Discharge: 2018-06-12 | Disposition: A | Discharge status: Home / Self Care | Payer: 59 | Source: Home / Self Care | Attending: Family Medicine | Admitting: Family Medicine

## 2018-06-12 ENCOUNTER — Other Ambulatory Visit: Payer: Self-pay

## 2018-06-12 DIAGNOSIS — R0981 Nasal congestion: Secondary | ICD-10-CM | POA: Diagnosis not present

## 2018-06-12 NOTE — Discharge Instructions (Addendum)
If cold-like symptoms develop, try the following: Take plain guaifenesin (1200mg  extended release tabs such as Mucinex) twice daily, with plenty of water, for cough and congestion.  May add Pseudoephedrine (30mg , one or two every 4 to 6 hours) for sinus congestion.  Get adequate rest.   May use Afrin nasal spray (or generic oxymetazoline) each morning for about 5 days and then discontinue.  Also recommend using saline nasal spray several times daily and saline nasal irrigation (AYR is a common brand).  Use Flonase nasal spray each morning after using Afrin nasal spray and saline nasal irrigation. Try warm salt water gargles for sore throat.  Stop all antihistamines for now, and other non-prescription cough/cold preparations. May take Delsym Cough Suppressant at bedtime for nighttime cough.

## 2018-06-12 NOTE — ED Triage Notes (Signed)
Pt started with nasal congestion sneezing and coughing last night.  Felt better today other than 2 bouts of diarrhea.

## 2018-06-15 ENCOUNTER — Ambulatory Visit (HOSPITAL_BASED_OUTPATIENT_CLINIC_OR_DEPARTMENT_OTHER): Payer: 59

## 2018-06-16 NOTE — ED Provider Notes (Signed)
[name]    CSN: [id number] Arrival date & time: 06/12/18  1600     History   Chief Complaint Chief Complaint  Patient presents with  . Cough  . Nasal Congestion    HPI Andre Henderson is a 48 y.o. male.   Patient develop nasal congestion, sneezing, and coughing last night.  Today he had two episodes of loose stools, and has had increased sinus drainage.  He denies fevers, chills, and sweats and does not feel ill.  The history is provided by the patient.    Past Medical History:  Diagnosis Date  . Arthritis   . Crohn disease ([place])   . Gout     Patient Active Problem List   Diagnosis Date Noted  . Gastroesophageal reflux disease without esophagitis 10/20/2017  . Fatty liver 05/16/2017  . IFG (impaired fasting glucose) 11/19/2013  . Gout 10/15/2013  . GERD (gastroesophageal reflux disease) 10/15/2013  . Tobacco abuse 10/15/2013  . 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 ANKLE FRACTURE SURGERY Right        Home Medications    Prior to Admission medications   Medication Sig Start Date End Date Taking? Authorizing Provider  allopurinol (ZYLOPRIM) 300 MG tablet TAKE ONE TABLET BY MOUTH ONE TIME DAILY 05/11/18   Hali Marry, MD  metFORMIN (GLUCOPHAGE-XR) 500 MG 24 hr tablet Take 1 tablet (500 mg total) by mouth daily with breakfast. 04/20/18   Hali Marry, MD  omeprazole (PRILOSEC) 10 MG capsule Take 10 mg by mouth every other day.     [provider]  tadalafil (ADCIRCA/CIALIS) 20 MG tablet Take 0.5-1 tablets (10-20 mg total) by mouth every other day as needed for erectile dysfunction. 04/20/18   Hali Marry, MD    Family History Family History  Problem Relation Age of Onset  . Diabetes Mother   . Hyperlipidemia Mother   . Hypertension Mother   . Diabetes Paternal Uncle     Social History Social History   Tobacco Use  .  Smoking status: Light Tobacco Smoker    Packs/day: 0.00    Types: Cigars  . Smokeless tobacco: Never Used  . Tobacco comment: One cigar per month.   Substance Use Topics  . Alcohol use: Yes    Alcohol/week: 10.0 standard drinks    Types: 10 Cans of beer per week  . Drug use: No     Allergies   Patient has no known allergies.   Review of Systems Review of Systems No sore throat + cough + sneezing No pleuritic pain No wheezing + nasal congestion + post-nasal drainage No sinus pain/pressure No itchy/red eyes No earache No hemoptysis No SOB No fever/chills No nausea No vomiting No abdominal pain + diarrhea, resolved No urinary symptoms No skin rash No fatigue No myalgias No headache    Physical Exam Triage Vital Signs ED Triage Vitals  Enc Vitals Group     BP 06/12/18 1636 (!) 159/88     Pulse Rate 06/12/18 1636 92     Resp 06/12/18 1636 20     Temp 06/12/18 1636 98.5 F (36.9 C)     Temp Source 06/12/18 1636 Oral     SpO2 06/12/18 1636 97 %     Weight 06/12/18 1640 206 lb (93.4 kg)     Height 06/12/18 1640 6' (1.829 m)     Head Circumference --  Peak Flow --      Pain Score 06/12/18 1638 0     Pain Loc --      Pain Edu? --      Excl. in [place]? --    No data found.  Updated Vital Signs BP (!) 159/88 (BP Location: Right Arm)   Pulse 92   Temp 98.5 F (36.9 C) (Oral)   Resp 20   Ht 6' (1.829 m)   Wt 93.4 kg   SpO2 97%   BMI 27.94 kg/m   Visual Acuity Right Eye Distance:   Left Eye Distance:   Bilateral Distance:    Right Eye Near:   Left Eye Near:    Bilateral Near:     Physical Exam Nursing notes and Vital Signs reviewed. Appearance:  Patient appears stated age, and in no acute distress Eyes:  Pupils are equal, round, and reactive to light and accomodation.  Extraocular movement is intact.  Conjunctivae are not inflamed  Ears:  Canals normal.  Tympanic membranes normal.  Nose:  Mildly congested turbinates.  No sinus tenderness.  Pharynx:  Normal Neck:  Supple.  Enlarged posterior/lateral nodes are palpated bilaterally, tender to palpation on the left.   Lungs:  Clear to auscultation.  Breath sounds are equal.  Moving air well. Heart:  Regular rate and rhythm without murmurs, rubs, or gallops.  Abdomen:  Nontender without masses or hepatosplenomegaly.  Bowel sounds are present.  No CVA or flank tenderness.  Extremities:  No edema.  Skin:  No rash present.    UC Treatments / Results  Labs (all labs ordered are listed, but only abnormal results are displayed) Labs Reviewed - No data to display  EKG None  Radiology No results found.  Procedures Procedures (including critical care time)  Medications Ordered in UC Medications - No data to display  Initial Impression / Assessment and Plan / UC Course  I have reviewed the triage vital signs and the nursing notes.  Pertinent labs & imaging results that were available during my care of the patient were reviewed by me and considered in my medical decision making (see chart for details).    There is no evidence of bacterial infection today.  ?early viral URI   Final Clinical Impressions(s) / UC Diagnoses   Final diagnoses:  Nasal congestion     Discharge Instructions     If cold-like symptoms develop, try the following: Take plain guaifenesin (1200mg  extended release tabs such as Mucinex) twice daily, with plenty of water, for cough and congestion.  May add Pseudoephedrine (30mg , one or two every 4 to 6 hours) for sinus congestion.  Get adequate rest.   May use Afrin nasal spray (or generic oxymetazoline) each morning for about 5 days and then discontinue.  Also recommend using saline nasal spray several times daily and saline nasal irrigation (AYR is a common brand).  Use Flonase nasal spray each morning after using Afrin nasal spray and saline nasal irrigation. Try warm salt water gargles for sore throat.  Stop all antihistamines for now, and other  non-prescription cough/cold preparations. May take Delsym Cough Suppressant at bedtime for nighttime cough.    ED Prescriptions    None        Kandra Nicolas, MD 06/16/18 [phone]

## 2018-06-24 ENCOUNTER — Encounter (HOSPITAL_BASED_OUTPATIENT_CLINIC_OR_DEPARTMENT_OTHER): Payer: 59

## 2018-07-17 ENCOUNTER — Telehealth: Payer: Self-pay | Admitting: Family Medicine

## 2018-07-17 NOTE — Telephone Encounter (Signed)
Pt left VM on triage line that he was returning Patricia's call. Requesting callback. Routing.

## 2018-07-17 NOTE — Telephone Encounter (Signed)
lvm letting patient knowing that anyone who answers the phone can get him scheduled. Thanks

## 2018-07-17 NOTE — Telephone Encounter (Signed)
Pt left another message on triage line trying to schedule his visit. Can you please contact him? If you cannot get him on his mobile, he ask we try his work number.  [phone] (Work) [phone] (Mobile)

## 2018-07-20 ENCOUNTER — Ambulatory Visit (INDEPENDENT_AMBULATORY_CARE_PROVIDER_SITE_OTHER): Payer: 59 | Admitting: Family Medicine

## 2018-07-20 ENCOUNTER — Ambulatory Visit: Payer: 59 | Admitting: Family Medicine

## 2018-07-20 ENCOUNTER — Encounter: Payer: Self-pay | Admitting: Family Medicine

## 2018-07-20 VITALS — BP 121/66 | HR 69 | Temp 98.7°F | Ht 70.0 in | Wt 200.0 lb

## 2018-07-20 DIAGNOSIS — R7301 Impaired fasting glucose: Secondary | ICD-10-CM

## 2018-07-20 DIAGNOSIS — Z1211 Encounter for screening for malignant neoplasm of colon: Secondary | ICD-10-CM

## 2018-07-20 DIAGNOSIS — L84 Corns and callosities: Secondary | ICD-10-CM | POA: Diagnosis not present

## 2018-07-20 LAB — POCT GLYCOSYLATED HEMOGLOBIN (HGB A1C): Hemoglobin A1C: 5.8 % — AB (ref 4.0–5.6)

## 2018-07-20 NOTE — Progress Notes (Addendum)
Subjective:    CC:   HPI:  Impaired fasting glucose-no increased thirst or urination. No symptoms consistent with hypoglycemia.  He is here today for 14-month follow-up as his hemoglobin A1c was elevated into that prediabetic range.  We discussed dietary changes as well as starting metformin.  He has been tolerating it well.  He says he did actually try the metformin a couple of times and even tried a half a tablet so did not feel well with it so stopped it.  He says he really made a big dietary change and that he cut out all soda the day that he saw me.  He says he knows he is had some things to eat that he probably should have but is really tried to eat it more in moderation and eat more things like salads and vegetables.  He is lost about 6 pounds since he was last here. Has been trying to exercise some.    He also has a lesion on his right great toe that he would like me to look at. He says it gets irritated in his shoe at times it is been there for quite some time.  Past medical history, Surgical history, Family history not pertinant except as noted below, Social history, Allergies, and medications have been entered into the medical record, reviewed, and corrections made.   Review of Systems: No fevers, chills, night sweats, weight loss, chest pain, or shortness of breath.   Objective:    General: Well Developed, well nourished, and in no acute distress.  Neuro: Alert and oriented x3, extra-ocular muscles intact, sensation grossly intact.  HEENT: Normocephalic, atraumatic  Skin: Warm and dry, no rashes. Cardiac: Regular rate and rhythm, no murmurs rubs or gallops, no lower extremity edema.  Respiratory: Clear to auscultation bilaterally. Not using accessory muscles, speaking in full sentences.   Impression and Recommendations:   IFG -has done a phenomenal job and really changing his diet and getting his A1c down.  A1c down to 5.8.  Just encouraged him to continue to work on the changes  that he is made for now we will discontinue the metformin.  I will see him back in 3 months his goal is to get under 5.7 and get everything back into a normal range and at that point we would just follow him for maintenance every 6 months. Lab Results  Component Value Date   HGBA1C 5.8 (A) 07/20/2018   Callus right great toe-used a #15 blade to debride most of the dead skin and pare it down.  Encouraged him to make sure that he is wearing good fitting shoes with plenty of room for his toes and avoiding anything that is causing extra friction or rubbing.  Also encouraged him to get an over-the-counter pumice stone to use in the shower each night to remove any of the dead skin off of the surface.  Also discussed colon cancer screening now that he is 71.  He is okay with screening colonoscopy so place referral that they may not schedule him until later this summer or early fall.

## 2018-07-20 NOTE — Telephone Encounter (Signed)
Pt scheduled for today, 07/20/18.

## 2018-08-07 ENCOUNTER — Ambulatory Visit: Payer: 59 | Admitting: Family Medicine

## 2018-09-21 ENCOUNTER — Other Ambulatory Visit: Payer: Self-pay

## 2018-09-21 ENCOUNTER — Emergency Department
Admission: EM | Admit: 2018-09-21 | Discharge: 2018-09-21 | Disposition: A | Discharge status: Home / Self Care | Payer: 59 | Source: Home / Self Care

## 2018-09-21 ENCOUNTER — Emergency Department (INDEPENDENT_AMBULATORY_CARE_PROVIDER_SITE_OTHER): Payer: 59

## 2018-09-21 DIAGNOSIS — M542 Cervicalgia: Secondary | ICD-10-CM

## 2018-09-21 DIAGNOSIS — M47812 Spondylosis without myelopathy or radiculopathy, cervical region: Secondary | ICD-10-CM

## 2018-09-21 DIAGNOSIS — M5412 Radiculopathy, cervical region: Secondary | ICD-10-CM

## 2018-09-21 MED ORDER — METHOCARBAMOL 500 MG PO TABS: 500.0000 mg | ORAL_TABLET | Freq: Two times a day (BID) | ORAL | 0 refills | Status: DC

## 2018-09-21 MED ORDER — MELOXICAM 15 MG PO TABS: 15.0000 mg | ORAL_TABLET | Freq: Every day | ORAL | 0 refills | Status: DC

## 2018-09-21 NOTE — ED Provider Notes (Signed)
[name]    CSN: [id number] Arrival date & time: 09/21/18  1111     History   Chief Complaint Chief Complaint  Patient presents with   Neck Pain   Shoulder Pain    RT    HPI Andre Henderson is a 48 y.o. male.   HPI  Andre Henderson is a 48 y.o. male presenting to UC with c/o 7-10 days of gradually worsening Right lower neck pain that radiates into his shoulder and Right upper arm. Pain is aching and sore, worse with certain movements or in certain positions. Pain usually worsens throughout the day. He reports using his Right arm to pull start a pressure washer the other week but pain did not start until a few days later. He initially thought he had a crick in his neck from sleeping but that pain typically resolves in a day or two.  He has taken Aleve with mild relief. Denies prior neck or back problems.    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 Authorizing Provider  allopurinol (ZYLOPRIM) 300 MG tablet TAKE ONE TABLET BY MOUTH ONE TIME DAILY 05/11/18   Hali Marry, MD  meloxicam (MOBIC) 15 MG tablet Take 1 tablet (15 mg total) by mouth daily. 09/21/18   Noe Gens, PA-C  methocarbamol (ROBAXIN) 500 MG tablet Take 1 tablet (500 mg total) by mouth 2 (two) times daily. 09/21/18   Noe Gens, PA-C  omeprazole (PRILOSEC) 10 MG capsule Take 10 mg by mouth every  other day.     [provider]  tadalafil (ADCIRCA/CIALIS) 20 MG tablet Take 0.5-1 tablets (10-20 mg total) by mouth every other day as needed for erectile dysfunction. 04/20/18   Hali Marry, MD    Family History Family History  Problem Relation Age of Onset   Diabetes Mother    Hyperlipidemia Mother    Hypertension Mother    Diabetes Paternal Uncle     Social History Social History   Tobacco Use   Smoking status: Light Tobacco Smoker    Packs/day: 0.00    Types: Cigars   Smokeless tobacco: Never Used   Tobacco comment: One cigar per month.   Substance Use Topics   Alcohol use: Yes    Alcohol/week: 10.0 standard drinks    Types: 10 Cans of beer per week   Drug use: No     Allergies   Patient has no known allergies.   Review of Systems Review of Systems  Musculoskeletal: Positive for arthralgias, myalgias and neck pain. Negative for gait problem and neck stiffness.  Neurological: Negative for weakness and numbness.     Physical Exam Triage Vital Signs ED Triage Vitals  Enc Vitals Group     BP 09/21/18 1125 124/79     Pulse Rate 09/21/18 1125 79     Resp  09/21/18 1125 18     Temp 09/21/18 1125 98.6 F (37 C)     Temp Source 09/21/18 1125 Oral     SpO2 09/21/18 1125 97 %     Weight 09/21/18 1126 198 lb (89.8 kg)     Height 09/21/18 1126 5\' 11"  (1.803 m)     Head Circumference --      Peak Flow --      Pain Score 09/21/18 1126 5     Pain Loc --      Pain Edu? --      Excl. in [place]? --    No data found.  Updated Vital Signs BP 124/79 (BP Location: Right Arm)    Pulse 79    Temp 98.6 F (37 C) (Oral)    Resp 18    Ht 5\' 11"  (6.294 m)    Wt 198 lb (89.8 kg)    SpO2 97%    BMI 27.62 kg/m   Visual Acuity Right Eye Distance:   Left Eye Distance:   Bilateral Distance:    Right Eye Near:   Left Eye Near:    Bilateral Near:     Physical Exam Vitals signs and nursing note reviewed.  Constitutional:      Appearance: Normal  appearance. He is well-developed.  HENT:     Head: Normocephalic and atraumatic.  Neck:     Musculoskeletal: Normal range of motion and neck supple.     Comments: No spinal tenderness. Tenderness to Right side cervical muscles. Full ROM. Cardiovascular:     Rate and Rhythm: Normal rate and regular rhythm.     Pulses:          Radial pulses are 2+ on the right side.  Pulmonary:     Effort: Pulmonary effort is normal.     Breath sounds: Normal breath sounds.  Musculoskeletal: Normal range of motion.        General: Tenderness present.     Comments: Mild tenderness to Right upper trapezius. No bony tenderness of shoulder. Full ROM Right shoulder, elbow, and wrist 5/5 strength bilaterally with ROM in shoulders, elbows, and grip strengths.   Skin:    General: Skin is warm and dry.  Neurological:     Mental Status: He is alert and oriented to person, place, and time.  Psychiatric:        Behavior: Behavior normal.      UC Treatments / Results  Labs (all labs ordered are listed, but only abnormal results are displayed) Labs Reviewed - No data to display  EKG None  Radiology Dg Cervical Spine Complete  Result Date: 09/21/2018 CLINICAL DATA:  Cervicalgia EXAM: CERVICAL SPINE - COMPLETE 4+ VIEW COMPARISON:  None. FINDINGS: Frontal, lateral, spot lumbosacral lateral, and bilateral oblique views were obtained. There is no fracture or spondylolisthesis. Prevertebral soft tissues and predental space regions are normal. There is moderate disc space narrowing at C5-6. Other disc spaces appear unremarkable. There are anterior osteophytes at C3, C5, and C6. There is facet hypertrophy with exit foraminal narrowing at C5-6 bilaterally and to a lesser extent at C3-4 and C4-5 bilaterally. No erosive changes. Lung apices are clear. IMPRESSION: Osteoarthritic change, most notably at C5-6. No fracture or spondylolisthesis. Electronically Signed   By: Lowella Grip III M.D.   On: 09/21/2018 11:54     Procedures Procedures (including critical care time)  Medications Ordered in UC Medications - No data to display  Initial Impression / Assessment and Plan / UC Course  I have reviewed the triage vital signs and the nursing notes.  Pertinent labs & imaging results that were available during my care of the patient were reviewed by me and considered in my medical decision making (see chart for details).     Reviewed imaging with pt Will tx conservatively with Meloxicam and Robaxin Home care info packet provided F/u with PCP or Sports Medicine if not improving within 1 week.  Final Clinical Impressions(s) / UC Diagnoses   Final diagnoses:  Neck pain on right side  Right cervical radiculopathy  Osteoarthritis of cervical spine, unspecified spinal osteoarthritis complication status     Discharge Instructions      Meloxicam (Mobic) is an antiinflammatory to help with pain and inflammation.  Do not take ibuprofen, Advil, Aleve, or any other medications that contain NSAIDs while taking meloxicam as this may cause stomach upset or even ulcers if taken in large amounts for an extended period of time.   Robaxin (methocarbamol) is a muscle relaxer and may cause drowsiness. Do not drink alcohol, drive, or operate heavy machinery while taking.  Please call to schedule an appointment with your primary care provider or with sports medicine if not improving by later this week or early next week.     ED Prescriptions    Medication Sig Dispense Auth. Provider   methocarbamol (ROBAXIN) 500 MG tablet Take 1 tablet (500 mg total) by mouth 2 (two) times daily. 20 tablet Noe Gens, PA-C   meloxicam (MOBIC) 15 MG tablet Take 1 tablet (15 mg total) by mouth daily. 15 tablet Noe Gens, PA-C     Controlled Substance Prescriptions [place] Controlled Substance Registry consulted? Not Applicable   Andre Henderson 09/21/18 1207

## 2018-09-21 NOTE — Discharge Instructions (Signed)
°  Meloxicam (Mobic) is an antiinflammatory to help with pain and inflammation.  Do not take ibuprofen, Advil, Aleve, or any other medications that contain NSAIDs while taking meloxicam as this may cause stomach upset or even ulcers if taken in large amounts for an extended period of time.   Robaxin (methocarbamol) is a muscle relaxer and may cause drowsiness. Do not drink alcohol, drive, or operate heavy machinery while taking.  Please call to schedule an appointment with your primary care provider or with sports medicine if not improving by later this week or early next week.

## 2018-09-21 NOTE — ED Triage Notes (Signed)
Pt c/o lower neck pain that will radiate down to his RT shoulder and upper arm. Sometimes numbness in his arm as well. Started about a week ago. Denies any injury. Taking aleve prn with little relief.

## 2018-10-19 ENCOUNTER — Ambulatory Visit: Payer: 59 | Admitting: Family Medicine

## 2018-10-27 ENCOUNTER — Other Ambulatory Visit: Payer: Self-pay

## 2018-10-27 ENCOUNTER — Encounter: Payer: Self-pay | Admitting: Family Medicine

## 2018-10-27 ENCOUNTER — Ambulatory Visit (INDEPENDENT_AMBULATORY_CARE_PROVIDER_SITE_OTHER): Payer: 59 | Admitting: Family Medicine

## 2018-10-27 VITALS — BP 110/67 | HR 69 | Ht 70.0 in | Wt 199.0 lb

## 2018-10-27 DIAGNOSIS — M5412 Radiculopathy, cervical region: Secondary | ICD-10-CM | POA: Diagnosis not present

## 2018-10-27 DIAGNOSIS — Z1211 Encounter for screening for malignant neoplasm of colon: Secondary | ICD-10-CM

## 2018-10-27 DIAGNOSIS — R7301 Impaired fasting glucose: Secondary | ICD-10-CM

## 2018-10-27 LAB — POCT GLYCOSYLATED HEMOGLOBIN (HGB A1C): Hemoglobin A1C: 6 % — AB (ref 4.0–5.6)

## 2018-10-27 MED ORDER — PREDNISONE 20 MG PO TABS: 40.0000 mg | ORAL_TABLET | Freq: Every day | ORAL | 0 refills | Status: DC

## 2018-10-27 NOTE — Patient Instructions (Addendum)
The numbness going down your arm is consistent with cervical radiculopathy which is basically where the nerve is being impinged upon in your neck.  I like to try 5 days of prednisone.  Also try to do the stretches below.  We can also consider formal physical therapy or if you would like to try chiropractor that can be helpful as well.  If not improving over the next month and please let me know and we will consider MRI for further evaluation.       Cervical Radiculopathy  Cervical radiculopathy means that a nerve in the neck (a cervical nerve) is pinched or bruised. This can happen because of an injury to the cervical spine (vertebrae) in the neck, or as a normal part of getting older. This can cause pain or loss of feeling (numbness) that runs from your neck all the way down to your arm and fingers. Often, this condition gets better with rest. Treatment may be needed if the condition does not get better. What are the causes?  A neck injury.  A bulging disk in your spine.  Muscle movements that you cannot control (muscle spasms).  Tight muscles in your neck due to overuse.  Arthritis.  Breakdown in the bones and joints of the spine (spondylosis) due to getting older.  Bone spurs that form near the nerves in the neck. What are the signs or symptoms?  Pain. The pain may: ? Run from the neck to the arm and hand. ? Be very bad or irritating. ? Be worse when you move your neck.  Loss of feeling or tingling in your arm or hand.  Weakness in your arm or hand, in very bad cases. How is this treated? In many cases, treatment is not needed for this condition. With rest, the condition often gets better over time. If treatment is needed, options may include:  Wearing a soft neck collar (cervical collar) for short periods of time, as told by your doctor.  Doing exercises (physical therapy) to strengthen your neck muscles.  Taking medicines.  Having shots (injections) in your spine, in  very bad cases.  Having surgery. This may be needed if other treatments do not help. The type of surgery that is used depends on the cause of your condition. Follow these instructions at home: If you have a soft neck collar:  Wear it as told by your doctor. Remove it only as told by your doctor.  Ask your doctor if you can remove the collar for cleaning and bathing. If you are allowed to remove the collar for cleaning or bathing: ? Follow instructions from your doctor about how to remove the collar safely. ? Clean the collar by wiping it with mild soap and water and drying it completely. ? Take out any removable pads in the collar every 1-2 days. Wash them by hand with soap and water. Let them air-dry completely before you put them back in the collar. ? Check your skin under the collar for redness or sores. If you see any, tell your doctor. Managing pain      Take over-the-counter and prescription medicines only as told by your doctor.  If told, put ice on the painful area. ? If you have a soft neck collar, remove it as told by your doctor. ? Put ice in a plastic bag. ? Place a towel between your skin and the bag. ? Leave the ice on for 20 minutes, 2-3 times a day.  If using ice does  not help, you can try using heat. Use the heat source that your doctor recommends, such as a moist heat pack or a heating pad. ? Place a towel between your skin and the heat source. ? Leave the heat on for 20-30 minutes. ? Remove the heat if your skin turns bright red. This is very important if you are unable to feel pain, heat, or cold. You may have a greater risk of getting burned.  You may try a gentle neck and shoulder rub (massage). Activity  Rest as needed.  Return to your normal activities as told by your doctor. Ask your doctor what activities are safe for you.  Do exercises as told by your doctor or physical therapist.  Do not lift anything that is heavier than 10 lb (4.5 kg) until your  doctor tells you that it is safe. General instructions  Use a flat pillow when you sleep.  Do not drive while wearing a soft neck collar. If you do not have a soft neck collar, ask your doctor if it is safe to drive while your neck heals.  Ask your doctor if the medicine prescribed to you requires you to avoid driving or using heavy machinery.  Do not use any products that contain nicotine or tobacco, such as cigarettes, e-cigarettes, and chewing tobacco. These can delay healing. If you need help quitting, ask your doctor.  Keep all follow-up visits as told by your doctor. This is important. Contact a doctor if:  Your condition does not get better with treatment. Get help right away if:  Your pain gets worse and is not helped with medicine.  You lose feeling or feel weak in your hand, arm, face, or leg.  You have a high fever.  You have a stiff neck.  You cannot control when you poop or pee (have incontinence).  You have trouble with walking, balance, or talking. Summary  Cervical radiculopathy means that a nerve in the neck is pinched or bruised.  A nerve can get pinched from a bulging disk, arthritis, an injury to the neck, or other causes.  Symptoms include pain, tingling, or loss of feeling that goes from the neck into the arm or hand.  Weakness in your arm or hand can happen in very bad cases.  Treatment may include resting, wearing a soft neck collar, and doing exercises. You might need to take medicines for pain. In very bad cases, shots or surgery may be needed. This information is not intended to replace advice given to you by your health care provider. Make sure you discuss any questions you have with your health care provider. Document Released: 03/07/2011 Document Revised: 02/06/2018 Document Reviewed: 02/06/2018 Elsevier Patient Education  2020 [employer].

## 2018-10-27 NOTE — Progress Notes (Signed)
Established Patient Office Visit  Subjective:  Patient ID: Andre Henderson, male    DOB: 10/24/1970  Age: 48 y.o. MRN: [id number]  CC:  Chief Complaint  Patient presents with  . ifg  . Osteoarthritis    he was told he had this when he was seen in UC 1 month ago. he still has tingling/numbness in both arms and hands constantly     HPI Andre Henderson presents for   Follow-up impaired fasting glucose-he had been working really hard to try to get his A1c down and really wanted to try to come off of medication.  His last A1c looked fantastic so we decided to go ahead and hold the metformin.  Instead of waiting 6 months to recheck his A1c and wanted to check it again in 3 months just to make sure that it stayed steady and did not jump up significantly.  He feels like he has been doing pretty good with his diet overall.  He is been try to keep his weight rate under 200 pounds.  He has continued to avoid soda.  He also wanted to discuss his recent urgent care visit.  He said he had been trying to start a power washer and could not get it to start in about 3 days later started to have severe pain in his neck.  He says it went on for several weeks and he finally went over to urgent care.  He was started to get some pain going down to his right arm and tingling.  He was given Mobic and methocarbamol but says it did not really help.  He says eventually over 2 to 3-week.  It gradually got better.  He is no longer having significant pain in his neck but now is having a numbness sensation in his right arm and hand mostly down towards the thumb side.  A little bit to the left comes and goes but is mostly the right that he is noticing.  He denies any weakness in strength.  Past Medical History:  Diagnosis Date  . Arthritis   . Crohn disease ([place])   . Gout     Past Surgical History:  Procedure Laterality Date  . ANKLE FRACTURE SURGERY Right     Family History  Problem Relation Age of Onset  . Diabetes Mother    . Hyperlipidemia Mother   . Hypertension Mother   . Diabetes Paternal Uncle     Social History   Socioeconomic History  . Marital status: Single    Spouse name: [name]  . Number of children: Not on file  . Years of education: Not on file  . Highest education level: Not on file  Occupational History  . Occupation: Careers adviser: [employer]  Social Needs  . Financial resource strain: Not on file  . Food insecurity    Worry: Not on file    Inability: Not on file  . Transportation needs    Medical: Not on file    Non-medical: Not on file  Tobacco Use  . Smoking status: Light Tobacco Smoker    Packs/day: 0.00    Types: Cigars  . Smokeless tobacco: Never Used  . Tobacco comment: One cigar per month.   Substance and Sexual Activity  . Alcohol use: Yes    Alcohol/week: 10.0 standard drinks    Types: 10 Cans of beer per week  . Drug use: No  . Sexual activity: Yes    Partners:  Female  Lifestyle  . Physical activity    Days per week: Not on file    Minutes per session: Not on file  . Stress: Not on file  Relationships  . Social Herbalist on phone: Not on file    Gets together: Not on file    Attends religious service: Not on file    Active member of club or organization: Not on file    Attends meetings of clubs or organizations: Not on file    Relationship status: Not on file  . Intimate partner violence    Fear of current or ex partner: Not on file    Emotionally abused: Not on file    Physically abused: Not on file    Forced sexual activity: Not on file  Other Topics Concern  . Not on file  Social History Narrative   1-2 sodas per day. Some regular exercise. Does smoke a cigar from time to time. 10 beers per week. He currently lives with [name] currently works at [employer].      Outpatient Medications Prior to Visit  Medication Sig Dispense Refill  . allopurinol (ZYLOPRIM) 300 MG tablet TAKE ONE  TABLET BY MOUTH ONE TIME DAILY 90 tablet 3  . omeprazole (PRILOSEC) 10 MG capsule Take 10 mg by mouth once a week.     . tadalafil (ADCIRCA/CIALIS) 20 MG tablet Take 0.5-1 tablets (10-20 mg total) by mouth every other day as needed for erectile dysfunction. 8 tablet 11  . meloxicam (MOBIC) 15 MG tablet Take 1 tablet (15 mg total) by mouth daily. 15 tablet 0  . methocarbamol (ROBAXIN) 500 MG tablet Take 1 tablet (500 mg total) by mouth 2 (two) times daily. 20 tablet 0   No facility-administered medications prior to visit.     No Known Allergies  ROS Review of Systems    Objective:    Physical Exam  Constitutional: He is oriented to person, place, and time. He appears well-developed and well-nourished.  HENT:  Head: Normocephalic and atraumatic.  Eyes: Conjunctivae and EOM are normal.  Cardiovascular: Normal rate.  Pulmonary/Chest: Effort normal.  Musculoskeletal:     Comments: Normal cervical flexion extension and rotation right and left which is symmetric.  Shoulders with normal range of motion bilaterally.  No significant weakness.  Neurological: He is alert and oriented to person, place, and time.  Skin: Skin is dry. No pallor.  Psychiatric: He has a normal mood and affect. His behavior is normal.  Vitals reviewed.   BP 110/67   Pulse 69   Ht 5\' 10"  (1.778 m)   Wt 199 lb (90.3 kg)   SpO2 98%   BMI 28.55 kg/m  Wt Readings from Last 3 Encounters:  10/27/18 199 lb (90.3 kg)  09/21/18 198 lb (89.8 kg)  07/20/18 200 lb (90.7 kg)     Health Maintenance Due  Topic Date Due  . COLONOSCOPY  06/20/2018    There are no preventive care reminders to display for this patient.  Lab Results  Component Value Date   TSH 1.06 04/20/2018   Lab Results  Component Value Date   WBC 8.0 04/20/2018   HGB 15.4 04/20/2018   HCT 44.7 04/20/2018   MCV 91.0 04/20/2018   PLT 321 04/20/2018   Lab Results  Component Value Date   NA 141 04/20/2018   K 4.9 04/20/2018   CO2 30  04/20/2018   GLUCOSE 105 (H) 04/20/2018   BUN 16 04/20/2018  CREATININE 0.96 04/20/2018   BILITOT 0.3 04/20/2018   ALKPHOS 52 09/06/2016   AST 23 04/20/2018   ALT 42 04/20/2018   PROT 7.0 04/20/2018   ALBUMIN 4.2 09/06/2016   CALCIUM 10.2 04/20/2018   Lab Results  Component Value Date   CHOL 196 04/20/2018   Lab Results  Component Value Date   HDL 47 04/20/2018   Lab Results  Component Value Date   LDLCALC 123 (H) 04/20/2018   Lab Results  Component Value Date   TRIG 148 04/20/2018   Lab Results  Component Value Date   CHOLHDL 4.2 04/20/2018   Lab Results  Component Value Date   HGBA1C 6.0 (A) 10/27/2018      Assessment & Plan:   Problem List Items Addressed This Visit      Endocrine   IFG (impaired fasting glucose) - Primary    A1c up to 6.0 which is expected considering he came off of his metformin but overall I think he is doing a good job just continue to work on [employer] and regular exercise and follow-up in 6 months.      Relevant Orders   POCT glycosylated hemoglobin (Hb A1C) (Completed)     Nervous and Auditory   Cervical radiculopathy at C6    Discussed diagnosis.  Even though the pain in the neck is resolved I still think he has some radicular symptoms.  We discussed doing prednisone burst as well as some physical therapy he wants to do home PT first.  If continuing or getting worse again then please give [place] a call back if any point he notices any muscle weakness complaints let us know.  He did have some significant bone spurring over the vertebrae so he does have some osteoarthritis in the cervical spine.  Consider MRI for further work-up if not improving.       Other Visit Diagnoses    Screening for colon cancer       Relevant Orders   Ambulatory referral to Gastroenterology      Meds ordered this encounter  Medications  . predniSONE (DELTASONE) 20 MG tablet    Sig: Take 2 tablets (40 mg total) by mouth daily with breakfast.     Dispense:  10 tablet    Refill:  0    Follow-up: Return in about 6 months (around 04/29/2019) for Pre-diabetes .    Beatrice Lecher, MD

## 2018-10-27 NOTE — Assessment & Plan Note (Signed)
Discussed diagnosis.  Even though the pain in the neck is resolved I still think he has some radicular symptoms.  We discussed doing prednisone burst as well as some physical therapy he wants to do home PT first.  If continuing or getting worse again then please give [place] a call back if any point he notices any muscle weakness complaints let us know.  He did have some significant bone spurring over the vertebrae so he does have some osteoarthritis in the cervical spine.  Consider MRI for further work-up if not improving.

## 2018-10-27 NOTE — Assessment & Plan Note (Signed)
A1c up to 6.0 which is expected considering he came off of his metformin but overall I think he is doing a good job just continue to work on [employer] and regular exercise and follow-up in 6 months.

## 2018-12-28 ENCOUNTER — Encounter: Payer: Self-pay | Admitting: Family Medicine

## 2019-01-01 ENCOUNTER — Encounter: Payer: Self-pay | Admitting: Physician Assistant

## 2019-01-12 ENCOUNTER — Encounter: Payer: Self-pay | Admitting: Physician Assistant

## 2019-01-12 ENCOUNTER — Other Ambulatory Visit: Payer: Self-pay

## 2019-01-12 ENCOUNTER — Ambulatory Visit (INDEPENDENT_AMBULATORY_CARE_PROVIDER_SITE_OTHER): Payer: 59 | Admitting: Physician Assistant

## 2019-01-12 VITALS — BP 110/70 | HR 80 | Temp 97.4°F | Ht 70.0 in | Wt 205.8 lb

## 2019-01-12 DIAGNOSIS — R195 Other fecal abnormalities: Secondary | ICD-10-CM | POA: Diagnosis not present

## 2019-01-12 DIAGNOSIS — Z1211 Encounter for screening for malignant neoplasm of colon: Secondary | ICD-10-CM | POA: Diagnosis not present

## 2019-01-12 DIAGNOSIS — K219 Gastro-esophageal reflux disease without esophagitis: Secondary | ICD-10-CM | POA: Diagnosis not present

## 2019-01-12 DIAGNOSIS — K625 Hemorrhage of anus and rectum: Secondary | ICD-10-CM

## 2019-01-12 MED ORDER — SUPREP BOWEL PREP KIT 17.5-3.13-1.6 GM/177ML PO SOLN: 1.0000 | ORAL | 0 refills | Status: DC

## 2019-01-12 NOTE — Patient Instructions (Signed)
If you are age 50 or older, your body mass index should be between 23-30. Your Body mass index is 29.53 kg/m. If this is out of the aforementioned range listed, please consider follow up with your Primary Care Provider.  If you are age 23 or younger, your body mass index should be between 19-25. Your Body mass index is 29.53 kg/m. If this is out of the aformentioned range listed, please consider follow up with your Primary Care Provider.    We have sent the following medications to your pharmacy for you to pick up at your convenience: [place] have been scheduled for a colonoscopy. Please follow written instructions given to you at your visit today.  Please pick up your prep supplies at the pharmacy within the next 1-3 days. If you use inhalers (even only as needed), please bring them with you on the day of your procedure.  Thank you for choosing me and [facility].  Dennison Bulla

## 2019-01-12 NOTE — Progress Notes (Signed)
Chief Complaint: GERD, soft stools, bright red blood per rectum  HPI:    Andre Henderson is a 48 year old male with a past medical history as listed below including Crohn's disease?, who was referred to me by Hali Marry, * for a complaint of GERD, soft stools and bright red blood per rectum.      12/25/2000 pathology from a colonoscopy shows cecal erosions with chronic colitis with focal acute cryptitis and rectosigmoid erosions with severe chronic colitis with focal acute cryptitis findings suggest inflammatory bowel disease and the multifocal nature suggested Crohn's disease.    Today, the patient presents to clinic and explains that he does not recall a diagnosis of Crohn's and was never on medication for this.  His cousin does have Crohn's. Seems to recall some rectal bleeding, maybe around that time, for which he had evaluation but does not really recall the colonoscopy or the results.  Tells me that he always has soft solid stool which is sometimes loose if "I eat bad or drink, but its not diarrhea", and he will sometimes see some bright red blood when wiping also when I "eat bad".  Tells me this has not changed recently.  Accompanied by some generalized abdominal discomfort "worse when I eat bad".  Tells me he quit drinking [facility] and stopped white bread and lost about 30 pounds since March of this year and feels in general better.    Also chronic reflux symptoms for which he takes over-the-counter Omeprazole every other day or every third day and this completely controls the symptoms.    Denies fever, chills, anorexia, nausea, vomiting or symptoms that awaken him from sleep.     Past Medical History:  Diagnosis Date  . Arthritis   . Crohn disease ([place])   . Gout     Past Surgical History:  Procedure Laterality Date  . ANKLE FRACTURE SURGERY Right     Current Outpatient Medications  Medication Sig Dispense Refill  . allopurinol (ZYLOPRIM) 300 MG tablet TAKE ONE TABLET BY  MOUTH ONE TIME DAILY 90 tablet 3  . omeprazole (PRILOSEC) 10 MG capsule Take 10 mg by mouth once a week.     . tadalafil (ADCIRCA/CIALIS) 20 MG tablet Take 0.5-1 tablets (10-20 mg total) by mouth every other day as needed for erectile dysfunction. (Patient not taking: Reported on 01/12/2019) 8 tablet 11   No current facility-administered medications for this visit.     Allergies as of 01/12/2019  . (No Known Allergies)    Family History  Problem Relation Age of Onset  . Diabetes Mother   . Hyperlipidemia Mother   . Hypertension Mother   . Diabetes Paternal Uncle     Social History   Socioeconomic History  . Marital status: Single    Spouse name: [name]  . Number of children: Not on file  . Years of education: Not on file  . Highest education level: Not on file  Occupational History  . Occupation: Careers adviser: [employer]  Social Needs  . Financial resource strain: Not on file  . Food insecurity    Worry: Not on file    Inability: Not on file  . Transportation needs    Medical: Not on file    Non-medical: Not on file  Tobacco Use  . Smoking status: Light Tobacco Smoker    Packs/day: 0.00    Types: Cigars  . Smokeless tobacco: Never Used  . Tobacco comment: One cigar  per month.   Substance and Sexual Activity  . Alcohol use: Yes    Alcohol/week: 10.0 standard drinks    Types: 10 Cans of beer per week  . Drug use: No  . Sexual activity: Yes    Partners: Female  Lifestyle  . Physical activity    Days per week: Not on file    Minutes per session: Not on file  . Stress: Not on file  Relationships  . Social Herbalist on phone: Not on file    Gets together: Not on file    Attends religious service: Not on file    Active member of club or organization: Not on file    Attends meetings of clubs or organizations: Not on file    Relationship status: Not on file  . Intimate partner violence    Fear of current or ex partner:  Not on file    Emotionally abused: Not on file    Physically abused: Not on file    Forced sexual activity: Not on file  Other Topics Concern  . Not on file  Social History Narrative   1-2 sodas per day. Some regular exercise. Does smoke a cigar from time to time. 10 beers per week. He currently lives with [name] currently works at [employer].      Review of Systems:    Constitutional: No weight loss, fever or chills Skin: No rash Cardiovascular: No chest pain  Respiratory: No SOB  Gastrointestinal: See HPI and otherwise negative Genitourinary: No dysuria Neurological: No headache, dizziness or syncope Musculoskeletal: No new muscle or joint pain Hematologic: No bruising Psychiatric: No history of depression or anxiety   Physical Exam:  Vital signs: BP 110/70   Pulse 80   Temp (!) 97.4 F (36.3 C)   Ht 5\' 10"  (1.778 m)   Wt 205 lb 12.8 oz (93.4 kg)   BMI 29.53 kg/m   Constitutional:   Pleasant Caucasian male appears to be in NAD, Well developed, Well nourished, alert and cooperative Head:  Normocephalic and atraumatic. Eyes:   PEERL, EOMI. No icterus. Conjunctiva pink. Ears:  Normal auditory acuity. Neck:  Supple Throat: Oral cavity and pharynx without inflammation, swelling or lesion.  Respiratory: Respirations even and unlabored. Lungs clear to auscultation bilaterally.   No wheezes, crackles, or rhonchi.  Cardiovascular: Normal S1, S2. No MRG. Regular rate and rhythm. No peripheral edema, cyanosis or pallor.  Gastrointestinal:  Soft, nondistended, nontender. No rebound or guarding. Normal bowel sounds. No appreciable masses or hepatomegaly. Rectal:  Not performed.  Msk:  Symmetrical without gross deformities. Without edema, no deformity or joint abnormality.  Neurologic:  Alert and  oriented x4;  grossly normal neurologically.  Skin:   Dry and intact without significant lesions or rashes. Psychiatric: Demonstrates good judgement and reason without  abnormal affect or behaviors.  RELEVANT LABS AND IMAGING: CBC    Component Value Date/Time   WBC 8.0 04/20/2018 1209   RBC 4.91 04/20/2018 1209   HGB 15.4 04/20/2018 1209   HCT 44.7 04/20/2018 1209   PLT 321 04/20/2018 1209   MCV 91.0 04/20/2018 1209   MCH 31.4 04/20/2018 1209   MCHC 34.5 04/20/2018 1209   RDW 12.9 04/20/2018 1209   LYMPHSABS 3,184 04/20/2018 1209   MONOABS 900 09/06/2016 1143   EOSABS 208 04/20/2018 1209   BASOSABS 48 04/20/2018 1209    CMP     Component Value Date/Time   NA 141 04/20/2018 1209  K 4.9 04/20/2018 1209   CL 104 04/20/2018 1209   CO2 30 04/20/2018 1209   GLUCOSE 105 (H) 04/20/2018 1209   BUN 16 04/20/2018 1209   CREATININE 0.96 04/20/2018 1209   CALCIUM 10.2 04/20/2018 1209   PROT 7.0 04/20/2018 1209   ALBUMIN 4.2 09/06/2016 1143   AST 23 04/20/2018 1209   ALT 42 04/20/2018 1209   ALKPHOS 52 09/06/2016 1143   BILITOT 0.3 04/20/2018 1209   GFRNONAA 92 04/20/2018 1209   GFRAA 107 04/20/2018 1209    Assessment: 1.  Rectal bleeding: Occasional, worse when "patient is eating bad", question diagnosis of Crohn's in the past; consider hemorrhoids versus IBD versus other 2.  Loose stools: Patient describes soft solid, but loose when "I am eating bad", questionable diagnosis of Crohn's in the past; consider Crohn's versus colitis versus IBS versus other 3.  Question history of Crohn's: Pathology from a colonoscopy in 2002 distinctly showing suspected Crohn's, but patient does not recall and is never been on medicine  Plan: 1.  Scheduled patient for colonoscopy in the [place] mainly for screening purposes.  It has been at least 10 years since his last and he is now 48 years old.  Patient also experiencing some rectal bleeding and loose stools but this is chronic for him.  There is a questionable diagnosis of Crohn's in the past.  Scheduled patient with Dr. Silverio Decamp in the [facility].  Did discuss risks, benefits, limitations and alternatives and the patient  agrees to proceed. 2.  Continue over-the-counter Omeprazole for reflux 3.  Patient to follow in clinic per recommendations from Dr. Silverio Decamp after time of procedure.  Ellouise Newer, PA-C [facility] 01/12/2019, 11:28 AM  Cc: Hali Marry, *

## 2019-01-15 ENCOUNTER — Encounter: Payer: Self-pay | Admitting: Gastroenterology

## 2019-01-15 ENCOUNTER — Telehealth: Payer: Self-pay | Admitting: Physician Assistant

## 2019-01-15 NOTE — Telephone Encounter (Signed)
pt wants to speak to the nurse/doctor about coding for procedure.  He said he received our letter about procedure falling under deductible.  I told him that he came in with a problem and it is considered diagnostic.  I also reminded him that his records go in with the claim and that there are certain coding guidelines that are followed by our practice.   He said his doctor recommended he have colonoscopy and that he had just mentioned he had problems before.  Very adamant about not paying for a preventative type of service.  Wants a call returned about this.

## 2019-01-18 ENCOUNTER — Telehealth: Payer: Self-pay

## 2019-01-18 NOTE — Telephone Encounter (Signed)
[name], please put in new ambulatory referral with screening dx as Andre Henderson said.  I am not allowed to change. Thanks

## 2019-01-18 NOTE — Telephone Encounter (Signed)
Amb ref has been done with dx of screening.

## 2019-01-18 NOTE — Telephone Encounter (Signed)
Covid-19 screening questions   Do you now or have you had a fever in the last 14 days? NO   Do you have any respiratory symptoms of shortness of breath or cough now or in the last 14 days? NO  Do you have any family members or close contacts with diagnosed or suspected Covid-19 in the past 14 days? NO  Have you been tested for Covid-19 and found to be positive? NO        

## 2019-01-18 NOTE — Addendum Note (Signed)
Addended by: Debbe Mounts on: 01/18/2019 10:03 AM   Modules accepted: Orders

## 2019-01-18 NOTE — Progress Notes (Signed)
Reviewed and agree with documentation and assessment and plan. K. Veena Nandigam , MD   

## 2019-01-18 NOTE — Telephone Encounter (Signed)
Thank you [name]! I called and left a detailed msg for the patient.

## 2019-01-18 NOTE — Telephone Encounter (Signed)
If you read my note, it was supposed to be scheduled for screening purposes. 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 Thanks, JLL

## 2019-01-19 ENCOUNTER — Ambulatory Visit (AMBULATORY_SURGERY_CENTER): Payer: 59 | Admitting: Gastroenterology

## 2019-01-19 ENCOUNTER — Other Ambulatory Visit: Payer: Self-pay

## 2019-01-19 ENCOUNTER — Encounter: Payer: Self-pay | Admitting: Gastroenterology

## 2019-01-19 VITALS — BP 114/75 | HR 66 | Temp 98.5°F | Resp 14 | Ht 70.0 in | Wt 205.0 lb

## 2019-01-19 DIAGNOSIS — D128 Benign neoplasm of rectum: Secondary | ICD-10-CM

## 2019-01-19 DIAGNOSIS — K621 Rectal polyp: Secondary | ICD-10-CM

## 2019-01-19 DIAGNOSIS — D124 Benign neoplasm of descending colon: Secondary | ICD-10-CM

## 2019-01-19 DIAGNOSIS — K5289 Other specified noninfective gastroenteritis and colitis: Secondary | ICD-10-CM | POA: Diagnosis not present

## 2019-01-19 DIAGNOSIS — Z1211 Encounter for screening for malignant neoplasm of colon: Secondary | ICD-10-CM

## 2019-01-19 DIAGNOSIS — D120 Benign neoplasm of cecum: Secondary | ICD-10-CM

## 2019-01-19 MED ORDER — MESALAMINE 1.2 G PO TBEC: 2.4000 g | DELAYED_RELEASE_TABLET | Freq: Every day | ORAL | 3 refills | Status: DC

## 2019-01-19 MED ORDER — SODIUM CHLORIDE 0.9 % IV SOLN: 500.0000 mL | Freq: Once | INTRAVENOUS | Status: DC

## 2019-01-19 NOTE — Patient Instructions (Signed)
Thank you for letting us take care of your healthcare needs today. See handouts given to you on Polyps. New prescription for Lialda 2.4 grams daily was sent to your pharmacy. PAthology results will be back in 1-2 weeks.    YOU HAD AN ENDOSCOPIC PROCEDURE TODAY AT [facility]:   Refer to the procedure report that was given to you for any specific questions about what was found during the examination.  If the procedure report does not answer your questions, please call your gastroenterologist to clarify.  If you requested that your care partner not be given the details of your procedure findings, then the procedure report has been included in a sealed envelope for you to review at your convenience later.  YOU SHOULD EXPECT: Some feelings of bloating in the abdomen. Passage of more gas than usual.  Walking can help get rid of the air that was put into your GI tract during the procedure and reduce the bloating. If you had a lower endoscopy (such as a colonoscopy or flexible sigmoidoscopy) you may notice spotting of blood in your stool or on the toilet paper. If you underwent a bowel prep for your procedure, you may not have a normal bowel movement for a few days.  Please Note:  You might notice some irritation and congestion in your nose or some drainage.  This is from the oxygen used during your procedure.  There is no need for concern and it should clear up in a day or so.  SYMPTOMS TO REPORT IMMEDIATELY:   Following lower endoscopy (colonoscopy or flexible sigmoidoscopy):  Excessive amounts of blood in the stool  Significant tenderness or worsening of abdominal pains  Swelling of the abdomen that is new, acute  Fever of 100F or higher   For urgent or emergent issues, a gastroenterologist can be reached at any hour by calling [phone].   DIET:  We do recommend a small meal at first, but then you may proceed to your regular diet.  Drink plenty of fluids but you should  avoid alcoholic beverages for 24 hours.  ACTIVITY:  You should plan to take it easy for the rest of today and you should NOT DRIVE or use heavy machinery until tomorrow (because of the sedation medicines used during the test).    FOLLOW UP: Our staff will call the number listed on your records 48-72 hours following your procedure to check on you and address any questions or concerns that you may have regarding the information given to you following your procedure. If we do not reach you, we will leave a message.  We will attempt to reach you two times.  During this call, we will ask if you have developed any symptoms of COVID 19. If you develop any symptoms (ie: fever, flu-like symptoms, shortness of breath, cough etc.) before then, please call [phone].  If you test positive for Covid 19 in the 2 weeks post procedure, please call and report this information to [place].    If any biopsies were taken you will be contacted by phone or by letter within the next 1-3 weeks.  Please call us at [phone] if you have not heard about the biopsies in 3 weeks.    SIGNATURES/CONFIDENTIALITY: You and/or your care partner have signed paperwork which will be entered into your electronic medical record.  These signatures attest to the fact that that the information above on your After Visit Summary has been reviewed and is understood.  Full responsibility of the confidentiality of this discharge information lies with you and/or your care-partner.

## 2019-01-19 NOTE — Progress Notes (Signed)
Report to PACU, RN, vss, BBS= Clear.  

## 2019-01-19 NOTE — Progress Notes (Signed)
Pt's states no medical or surgical changes since previsit or office visit. 

## 2019-01-19 NOTE — Op Note (Signed)
[place] Patient Name: Andre Henderson Procedure Date: 01/19/2019 10:26 AM MRN: [id number] Endoscopist: Mauri Pole , MD Age: 48 Referring MD:  Date of Birth: 1970/07/01 Gender: Male Account #: [id number] Procedure:                Colonoscopy Indications:              Screening for colorectal malignant neoplasm Medicines:                Monitored Anesthesia Care Procedure:                Pre-Anesthesia Assessment:                           - Prior to the procedure, a History and Physical                            was performed, and patient medications and                            allergies were reviewed. The patient's tolerance of                            previous anesthesia was also reviewed. The risks                            and benefits of the procedure and the sedation                            options and risks were discussed with the patient.                            All questions were answered, and informed consent                            was obtained. Prior Anticoagulants: The patient has                            taken no previous anticoagulant or antiplatelet                            agents. ASA Grade Assessment: II - A patient with                            mild systemic disease. After reviewing the risks                            and benefits, the patient was deemed in                            satisfactory condition to undergo the procedure.                           After obtaining informed consent, the colonoscope  was passed under direct vision. Throughout the                            procedure, the patient's blood pressure, pulse, and                            oxygen saturations were monitored continuously. The                            Colonoscope was introduced through the anus and                            advanced to the the terminal ileum, with                            identification of the  appendiceal orifice and IC                            valve. The colonoscopy was performed without                            difficulty. The patient tolerated the procedure                            well. The quality of the bowel preparation was                            excellent. The terminal ileum, ileocecal valve,                            appendiceal orifice, and rectum were photographed. Scope In: 10:32:29 AM Scope Out: 10:51:29 AM Scope Withdrawal Time: 0 hours 16 minutes 55 seconds  Total Procedure Duration: 0 hours 19 minutes 0 seconds  Findings:                 The perianal and digital rectal examinations were                            normal.                           The terminal ileum appeared normal. Biopsies were                            taken with a cold forceps for histology.                           Three sessile polyps were found in the rectum,                            descending colon and ascending colon. The polyps                            were 4 to 9 mm in size. These polyps were removed  with a cold snare. Resection and retrieval were                            complete.                           A less than 1 mm polyp was found in the rectum. The                            polyp was sessile. The polyp was removed with a                            cold biopsy forceps. Resection and retrieval were                            complete.                           A patchy area of mildly altered vascular,                            pseudopolypoid and scarred mucosa was found in the                            transverse colon, in the ascending colon and in the                            cecum. Biopsies were taken with a cold forceps for                            histology. Complications:            No immediate complications. Estimated Blood Loss:     Estimated blood loss was minimal. Impression:               - The examined portion  of the ileum was normal.                            Biopsied.                           - Three 4 to 9 mm polyps in the rectum, in the                            descending colon and in the ascending colon,                            removed with a cold snare. Resected and retrieved.                           - One less than 1 mm polyp in the rectum, removed                            with a cold biopsy forceps. Resected and retrieved.                           -  Altered vascular, pseudopolypoid and scarred                            mucosa in the transverse colon, in the ascending                            colon and in the cecum. Biopsied. Recommendation:           - Patient has a contact number available for                            emergencies. The signs and symptoms of potential                            delayed complications were discussed with the                            patient. Return to normal activities tomorrow.                            Written discharge instructions were provided to the                            patient.                           - Resume previous diet.                           - Continue present medications.                           - Await pathology results.                           - Repeat colonoscopy in 3 years for surveillance                            based on pathology results.                           - Lialda 2.4gm daily (Rx 90 days with 3 refills)                           - Return to GI clinic at the next available                            appointment. Mauri Pole, MD 01/19/2019 10:59:37 AM This report has been signed electronically.

## 2019-01-19 NOTE — Progress Notes (Signed)
Called to room to assist during endoscopic procedure.  Patient ID and intended procedure confirmed with present staff. Received instructions for my participation in the procedure from the performing physician.  

## 2019-01-21 ENCOUNTER — Telehealth: Payer: Self-pay

## 2019-01-21 NOTE — Telephone Encounter (Signed)
Attempted to reach pt. With follow-up call following endoscopic procedure 01/19/2019.  LM on pt. Voice mail to call if he has any questions or concerns.

## 2019-01-21 NOTE — Telephone Encounter (Signed)
Follow up call attempted.  NALM  

## 2019-01-22 ENCOUNTER — Encounter: Payer: Self-pay | Admitting: Gastroenterology

## 2019-02-02 ENCOUNTER — Telehealth: Payer: Self-pay | Admitting: *Deleted

## 2019-02-02 NOTE — Telephone Encounter (Signed)
Sent prior auth to Cover My meds today, waiting on response

## 2019-02-04 ENCOUNTER — Other Ambulatory Visit: Payer: Self-pay

## 2019-02-04 NOTE — Telephone Encounter (Signed)
This medication has been approved from 01/29/2019-01/29/2020  [place] Ref number [id number]

## 2019-03-15 ENCOUNTER — Ambulatory Visit: Payer: 59 | Admitting: Gastroenterology

## 2019-04-21 ENCOUNTER — Ambulatory Visit: Payer: 59 | Admitting: Gastroenterology

## 2019-04-27 ENCOUNTER — Other Ambulatory Visit: Payer: Self-pay

## 2019-04-27 ENCOUNTER — Ambulatory Visit: Payer: 59 | Admitting: Family Medicine

## 2019-04-27 NOTE — Progress Notes (Deleted)
Established Patient Office Visit  Subjective:  Patient ID: Andre Henderson, male    DOB: [birth year]  Age: 49 y.o. MRN: [id number]  CC: No chief complaint on file.   HPI Andre Henderson presents for   Impaired fasting glucose-no increased thirst or urination. No symptoms consistent with hypoglycemia.  Fatty liver -   F/U Gout -   Past Medical History:  Diagnosis Date  . Arthritis   . Colitis 2002  . Gout     Past Surgical History:  Procedure Laterality Date  . ANKLE FRACTURE SURGERY Right     Family History  Problem Relation Age of Onset  . Diabetes Mother   . Hyperlipidemia Mother   . Hypertension Mother   . Diabetes Paternal Uncle   . Crohn's disease Cousin        2nd cousin  . Pancreatic cancer Cousin   . Colon cancer Neg Hx   . Esophageal cancer Neg Hx     Social History   Socioeconomic History  . Marital status: Single    Spouse name: [name]  . Number of children: Not on file  . Years of education: Not on file  . Highest education level: Not on file  Occupational History  . Occupation: Careers adviser: [employer]  Tobacco Use  . Smoking status: Light Tobacco Smoker    Packs/day: 0.00    Types: Cigars  . Smokeless tobacco: Never Used  . Tobacco comment: One cigar per month.   Substance and Sexual Activity  . Alcohol use: Yes    Alcohol/week: 10.0 standard drinks    Types: 10 Cans of beer per week  . Drug use: No  . Sexual activity: Yes    Partners: Female  Other Topics Concern  . Not on file  Social History Narrative   1-2 sodas per day. Some regular exercise. Does smoke a cigar from time to time. 10 beers per week. He currently lives with [name] currently works at [employer].     Social Determinants of Health   Financial Resource Strain:   . Difficulty of Paying Living Expenses: Not on file  Food Insecurity:   . Worried About Charity fundraiser in the Last Year: Not on file  . Ran Out of Food in  the Last Year: Not on file  Transportation Needs:   . Lack of Transportation (Medical): Not on file  . Lack of Transportation (Non-Medical): Not on file  Physical Activity:   . Days of Exercise per Week: Not on file  . Minutes of Exercise per Session: Not on file  Stress:   . Feeling of Stress : Not on file  Social Connections:   . Frequency of Communication with Friends and Family: Not on file  . Frequency of Social Gatherings with Friends and Family: Not on file  . Attends Religious Services: Not on file  . Active Member of Clubs or Organizations: Not on file  . Attends Archivist Meetings: Not on file  . Marital Status: Not on file  Intimate Partner Violence:   . Fear of Current or Ex-Partner: Not on file  . Emotionally Abused: Not on file  . Physically Abused: Not on file  . Sexually Abused: Not on file    Outpatient Medications Prior to Visit  Medication Sig Dispense Refill  . allopurinol (ZYLOPRIM) 300 MG tablet TAKE ONE TABLET BY MOUTH ONE TIME DAILY 90 tablet 3  . mesalamine (LIALDA) 1.2 g  EC tablet Take 2 tablets (2.4 g total) by mouth daily with breakfast. 90 tablet 3  . omeprazole (PRILOSEC) 10 MG capsule Take 10 mg by mouth once a week.     . tadalafil (ADCIRCA/CIALIS) 20 MG tablet Take 0.5-1 tablets (10-20 mg total) by mouth every other day as needed for erectile dysfunction. (Patient not taking: Reported on 01/12/2019) 8 tablet 11   Facility-Administered Medications Prior to Visit  Medication Dose Route Frequency Provider Last Rate Last Admin  . 0.9 %  sodium chloride infusion  500 mL Intravenous Once Nandigam, Kavitha V, MD        No Known Allergies  ROS Review of Systems    Objective:    Physical Exam  There were no vitals taken for this visit. Wt Readings from Last 3 Encounters:  01/19/19 205 lb (93 kg)  01/12/19 205 lb 12.8 oz (93.4 kg)  10/27/18 199 lb (90.3 kg)     There are no preventive care reminders to display for this  patient.  There are no preventive care reminders to display for this patient.  Lab Results  Component Value Date   TSH 1.06 04/20/2018   Lab Results  Component Value Date   WBC 8.0 04/20/2018   HGB 15.4 04/20/2018   HCT 44.7 04/20/2018   MCV 91.0 04/20/2018   PLT 321 04/20/2018   Lab Results  Component Value Date   NA 141 04/20/2018   K 4.9 04/20/2018   CO2 30 04/20/2018   GLUCOSE 105 (H) 04/20/2018   BUN 16 04/20/2018   CREATININE 0.96 04/20/2018   BILITOT 0.3 04/20/2018   ALKPHOS 52 09/06/2016   AST 23 04/20/2018   ALT 42 04/20/2018   PROT 7.0 04/20/2018   ALBUMIN 4.2 09/06/2016   CALCIUM 10.2 04/20/2018   Lab Results  Component Value Date   CHOL 196 04/20/2018   Lab Results  Component Value Date   HDL 47 04/20/2018   Lab Results  Component Value Date   LDLCALC 123 (H) 04/20/2018   Lab Results  Component Value Date   TRIG 148 04/20/2018   Lab Results  Component Value Date   CHOLHDL 4.2 04/20/2018   Lab Results  Component Value Date   HGBA1C 6.0 (A) 10/27/2018      Assessment & Plan:   Problem List Items Addressed This Visit      Digestive   Fatty liver - Primary     Endocrine   IFG (impaired fasting glucose)     Other   Gout      No orders of the defined types were placed in this encounter.   Follow-up: No follow-ups on file.    Beatrice Lecher, MD

## 2019-07-10 ENCOUNTER — Other Ambulatory Visit: Payer: Self-pay | Admitting: Family Medicine

## 2019-07-27 ENCOUNTER — Other Ambulatory Visit: Payer: Self-pay | Admitting: Family Medicine

## 2019-10-02 IMAGING — DX CERVICAL SPINE - COMPLETE 4+ VIEW
6 series · 6 of 6 positions shown · non-contrast
Comparison: None.

CLINICAL DATA: Cervicalgia

EXAM:
CERVICAL SPINE - COMPLETE 4+ VIEW

[c-spine lat]
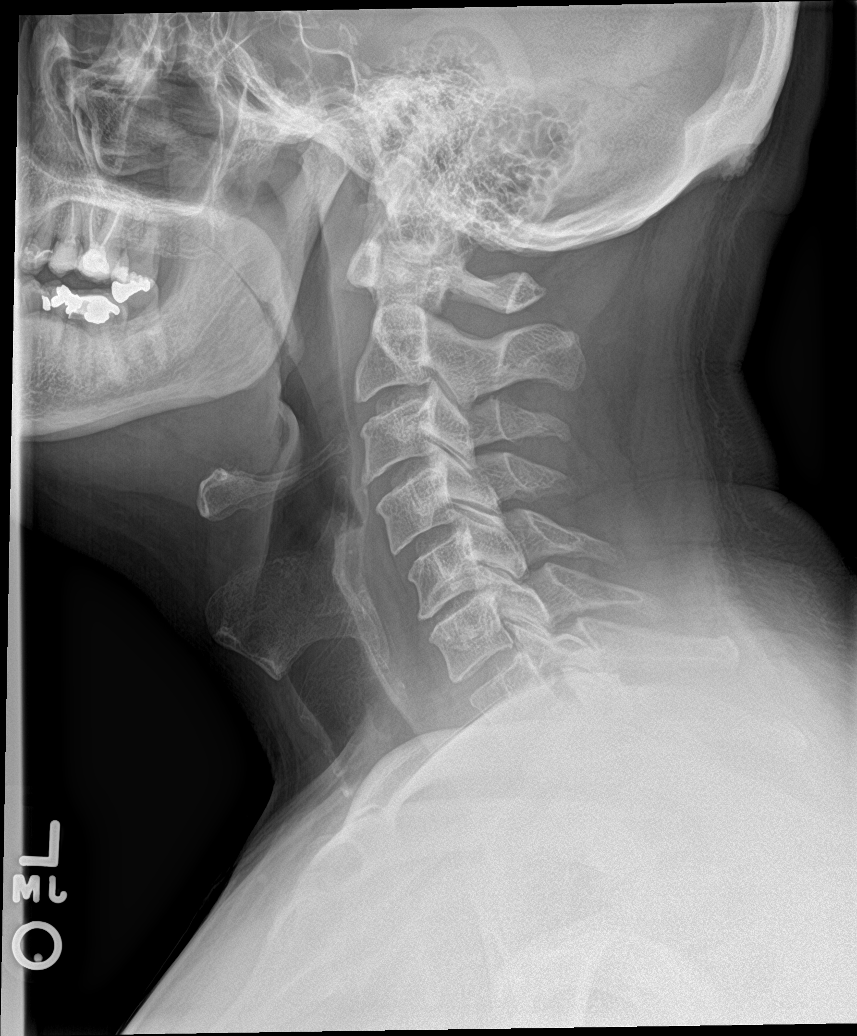

[c-spine obl (1 of 2)]
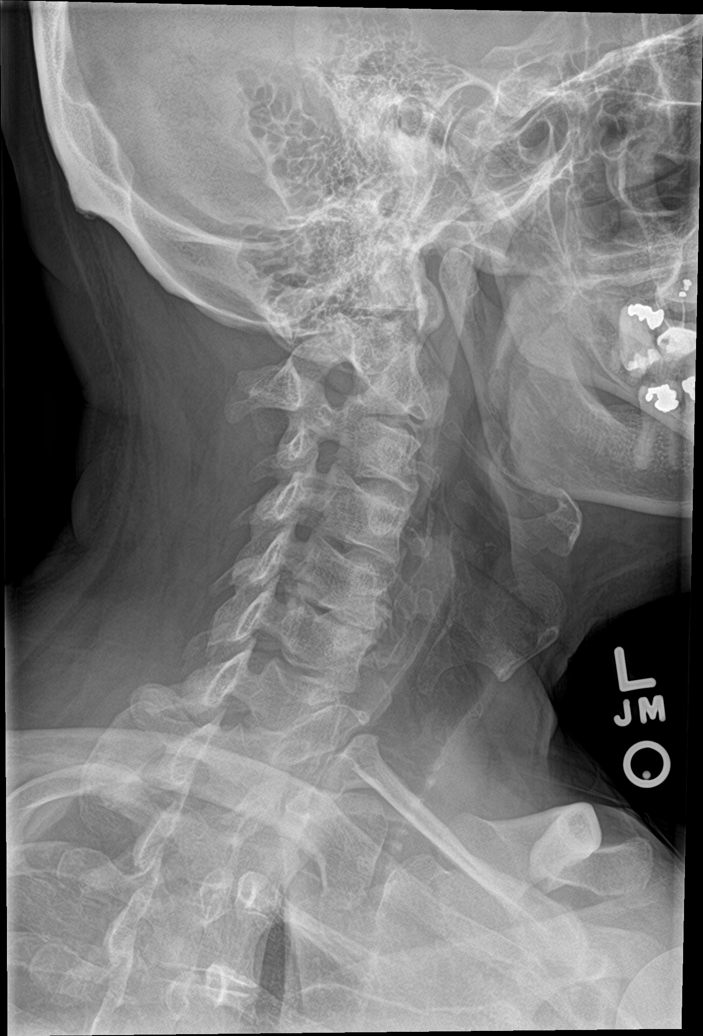

[c-spine obl (2 of 2)]
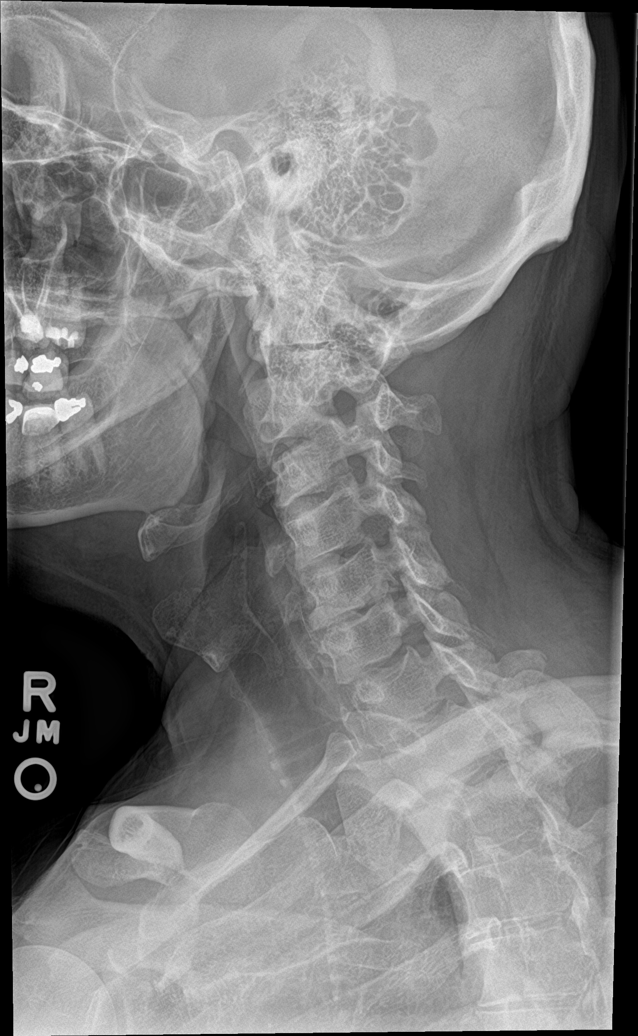

[c-spine ap]
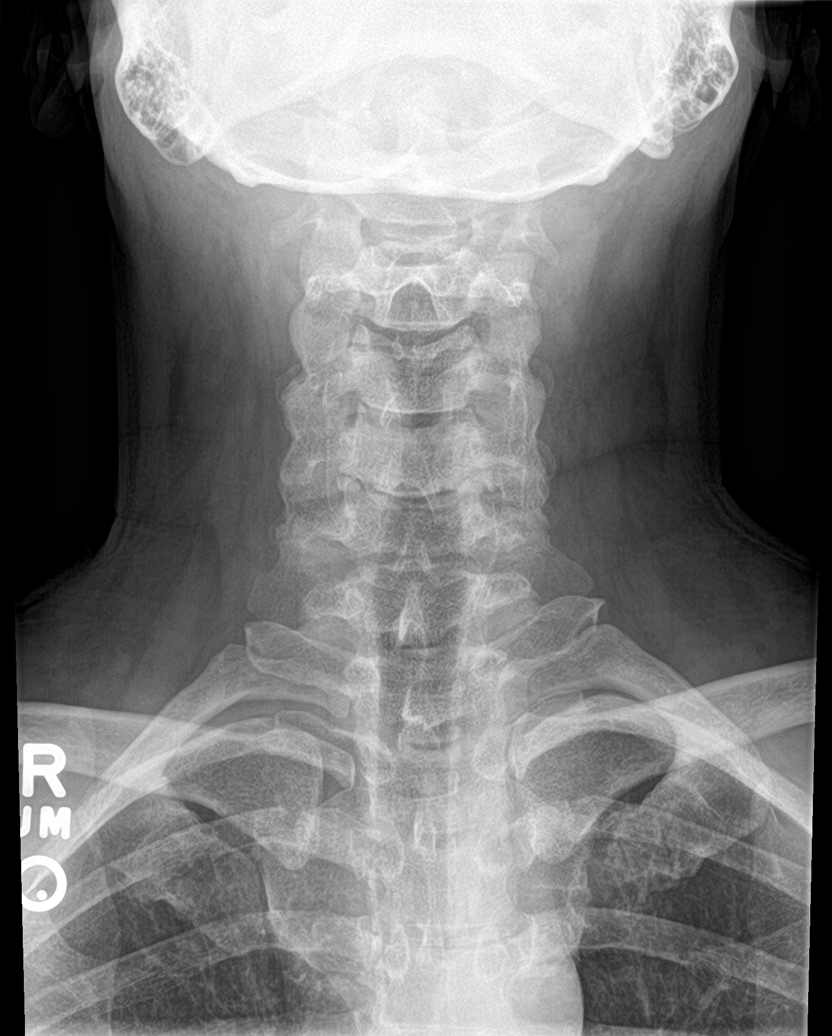

[c-spine open mouth]
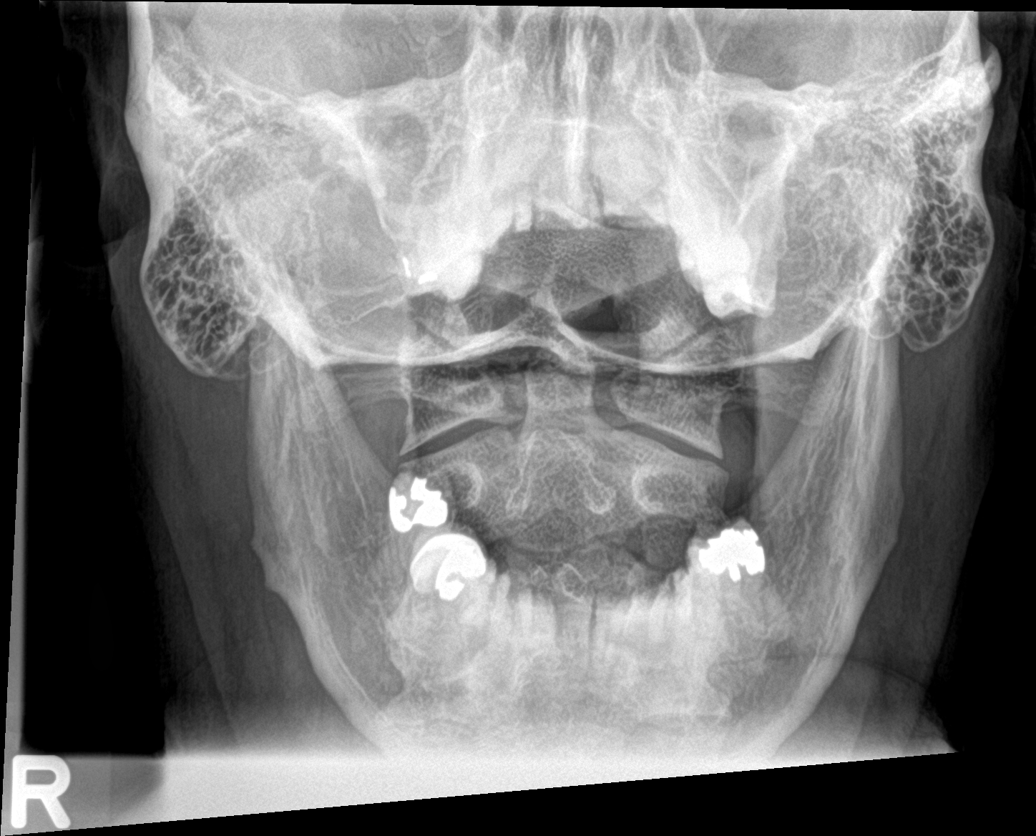

[c-spine swimmers]
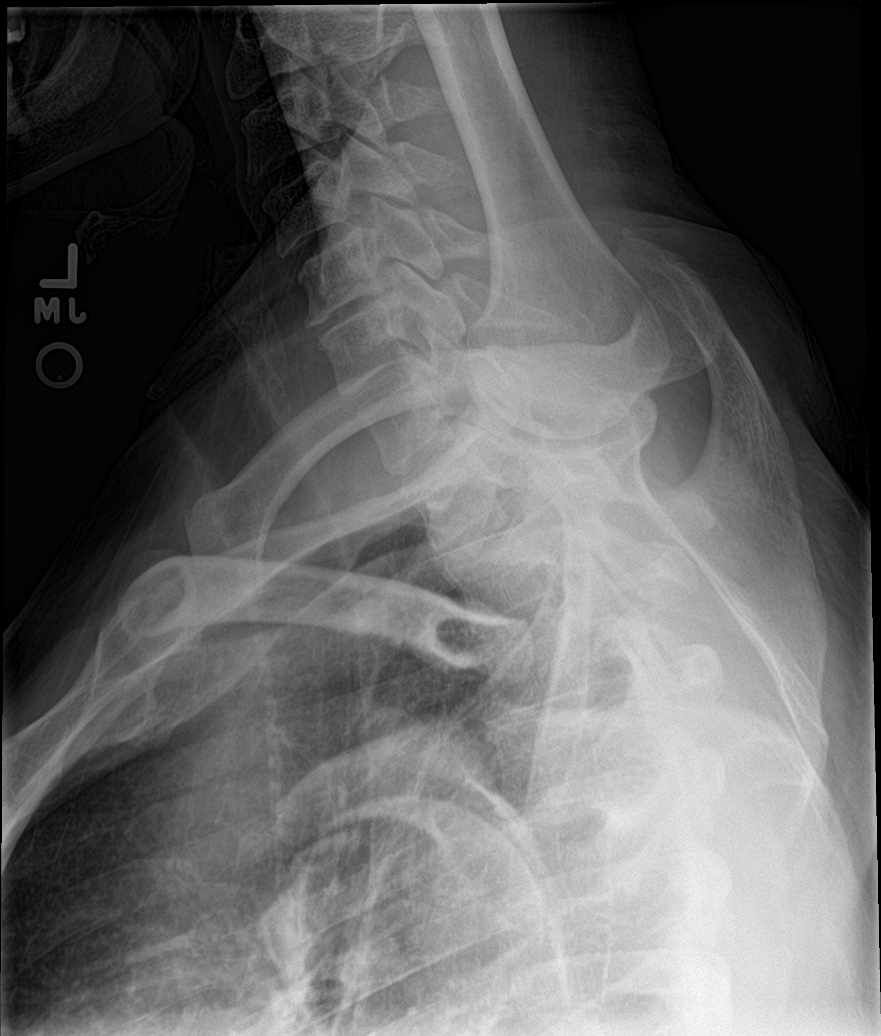

[6 of 6 positions shown; findings below may reference images not displayed]

FINDINGS: Frontal, lateral, spot lumbosacral lateral, and bilateral oblique
views were obtained. There is no fracture or spondylolisthesis.
Prevertebral soft tissues and predental space regions are normal.
There is moderate disc space narrowing at C5-6. Other disc spaces
appear unremarkable. There are anterior osteophytes at C3, C5, and
C6. There is facet hypertrophy with exit foraminal narrowing at C5-6
bilaterally and to a lesser extent at C3-4 and C4-5 bilaterally. No
erosive changes. Lung apices are clear.
IMPRESSION: Osteoarthritic change, most notably at C5-6. No fracture or
spondylolisthesis.

## 2019-10-08 ENCOUNTER — Other Ambulatory Visit: Payer: Self-pay | Admitting: Family Medicine

## 2019-12-31 ENCOUNTER — Encounter: Payer: Self-pay | Admitting: *Deleted

## 2019-12-31 ENCOUNTER — Other Ambulatory Visit: Payer: Self-pay

## 2019-12-31 ENCOUNTER — Emergency Department (INDEPENDENT_AMBULATORY_CARE_PROVIDER_SITE_OTHER)
Admission: EM | Admit: 2019-12-31 | Discharge: 2019-12-31 | Disposition: A | Discharge status: Home / Self Care | Payer: 59 | Source: Home / Self Care

## 2019-12-31 DIAGNOSIS — L918 Other hypertrophic disorders of the skin: Secondary | ICD-10-CM

## 2019-12-31 DIAGNOSIS — R21 Rash and other nonspecific skin eruption: Secondary | ICD-10-CM

## 2019-12-31 MED ORDER — TRIAMCINOLONE ACETONIDE 0.1 % EX CREA
1.0000 | TOPICAL_CREAM | Freq: Two times a day (BID) | CUTANEOUS | 0 refills | Status: DC
Start: 2019-12-31 — End: 2022-03-05

## 2019-12-31 NOTE — ED Provider Notes (Signed)
Andre Henderson CARE    CSN: [id number] Arrival date & time: 12/31/19  1833      History   Chief Complaint Chief Complaint  Patient presents with  . Rash    LLE    HPI Andre Henderson is a 49 y.o. male.   HPI  Patient presents with a he is unaware of being bit right having any contact with any irritant.  He denies any pain or itching at the site.  Lower extremity has no surrounding erythema or edema. Patient is also concerned regarding a skin tag that is present mid chest.  He endorses that the area is irritated and has a hardened substance excreting from the site.  This is been present for some time.  Patient would like to have the area removed.  Past Medical History:  Diagnosis Date  . Arthritis   . Colitis 2002  . Gout     Patient Active Problem List   Diagnosis Date Noted  . Cervical radiculopathy at C6 10/27/2018  . Gastroesophageal reflux disease without esophagitis 10/20/2017  . Fatty liver 05/16/2017  . IFG (impaired fasting glucose) 11/19/2013  . Gout 10/15/2013  . Tobacco abuse 10/15/2013  . 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 ANKLE FRACTURE SURGERY Right        Home Medications    Prior to Admission medications   Medication Sig Start Date End Date Taking? Authorizing Provider  allopurinol (ZYLOPRIM) 300 MG tablet Take 1 tablet (300 mg total) by mouth daily. 10/08/19   Hali Marry, MD  omeprazole (PRILOSEC) 10 MG capsule Take 10 mg by mouth once a week.     [provider]    Family History Family History  Problem Relation Age of Onset  . Diabetes Mother   . Hyperlipidemia Mother   . Hypertension Mother   . Diabetes Paternal Uncle   . Crohn's disease Cousin        2nd cousin  . Pancreatic cancer Cousin   . Colon cancer Neg Hx   . Esophageal cancer Neg Hx     Social History Social History   Tobacco Use  . Smoking status: Light Tobacco  Smoker    Packs/day: 0.00    Types: Cigars  . Smokeless tobacco: Never Used  . Tobacco comment: One cigar per month.   Vaping Use  . Vaping Use: Never used  Substance Use Topics  . Alcohol use: Yes    Alcohol/week: 10.0 standard drinks    Types: 10 Cans of beer per week  . Drug use: No     Allergies   Patient has no known allergies.   Review of Systems Review of Systems Pertinent negatives listed in HPI Physical Exam Triage Vital Signs ED Triage Vitals  Enc Vitals Group     BP 12/31/19 1850 (!) 152/83     Pulse Rate 12/31/19 1850 77     Resp 12/31/19 1850 14     Temp 12/31/19 1850 98.1 F (36.7 C)     Temp Source 12/31/19 1850 Oral     SpO2 12/31/19 1850 97 %     Weight 12/31/19 1851 217 lb (98.4 kg)     Height --      Head Circumference --      Peak Flow --      Pain Score 12/31/19 1851 0     Pain Loc --      Pain  Edu? --      Excl. in [place]? --    No data found.  Updated Vital Signs BP (!) 152/83 (BP Location: Right Arm)   Pulse 77   Temp 98.1 F (36.7 C) (Oral)   Resp 14   Wt 217 lb (98.4 kg)   SpO2 97%   BMI 31.14 kg/m   Visual Acuity Right Eye Distance:   Left Eye Distance:   Bilateral Distance:    Right Eye Near:   Left Eye Near:    Bilateral Near:     Physical Exam Cardiovascular:     Rate and Rhythm: Normal rate and regular rhythm.  Pulmonary:     Effort: Pulmonary effort is normal.     Breath sounds: Normal breath sounds.  Skin:    General: Skin is warm.     Findings: Rash present. Rash is macular.          Comments: LLE macular 80mm erythematous site without fluctuance Mid chest skin tag with sebaceous material present   Neurological:     General: No focal deficit present.     Mental Status: He is alert.  Psychiatric:        Attention and Perception: Attention normal.        Mood and Affect: Mood normal.    Procedure Note: Cleansed with betadine. No anesthesia.Partially retained skin tag removed with use of small forceps.     UC Treatments / Results  Labs (all labs ordered are listed, but only abnormal results are displayed) Labs Reviewed - No data to display  EKG   Radiology No results found.  Procedures Procedures (including critical care time)  Medications Ordered in UC Medications - No data to display  Initial Impression / Assessment and Plan / UC Course  I have reviewed the triage vital signs and the nursing notes.  Pertinent labs & imaging results that were available during my care of the patient were reviewed by me and considered in my medical decision making (see chart for details).    Skin tag removed and sebaceous material expressed from site. Skin rash unknown etiology. Advised to trial some triamcinolone cream to resolve rash. Continue to monitor site for increasing diameter changes in colors. If any of these finding develop, return for evaluation or follow-up with a dermatologist for skin biopsy.  At present, rash in its appearance is unremarkable. Final Clinical Impressions(s) / UC Diagnoses   Final diagnoses:  Skin tag  Rash, skin   Discharge Instructions   None    ED Prescriptions    Medication Sig Dispense Auth. Provider   triamcinolone cream (KENALOG) 0.1 % Apply 1 application topically 2 (two) times daily. 60 g Scot Jun, FNP     PDMP not reviewed this encounter.   Scot Jun, FNP 01/04/20 0730

## 2019-12-31 NOTE — ED Triage Notes (Signed)
Patient c/o reddened circular area to Left calf x 4 days. Denies itching or pain.  Patient also c/o skin tag to his chest that has grown and itches.

## 2020-01-06 ENCOUNTER — Other Ambulatory Visit: Payer: Self-pay | Admitting: Family Medicine

## 2020-01-14 ENCOUNTER — Other Ambulatory Visit: Payer: Self-pay

## 2020-01-14 ENCOUNTER — Encounter: Payer: Self-pay | Admitting: Medical-Surgical

## 2020-01-14 ENCOUNTER — Telehealth (INDEPENDENT_AMBULATORY_CARE_PROVIDER_SITE_OTHER): Payer: 59 | Admitting: Medical-Surgical

## 2020-01-14 VITALS — HR 81 | Temp 98.6°F

## 2020-01-14 DIAGNOSIS — U071 COVID-19: Secondary | ICD-10-CM

## 2020-01-14 NOTE — Progress Notes (Signed)
Virtual Visit via Video Note  I connected with Andre Henderson on 01/14/20 at  2:00 PM EDT by a video enabled telemedicine application and verified that I am speaking with the correct person using two identifiers.   I discussed the limitations of evaluation and management by telemedicine and the availability of in person appointments. The patient expressed understanding and agreed to proceed.  Patient location: home Provider locations: office  Subjective:    CC: Covid positive  HPI: Pleasant 49 year old male presenting via MyChart video visit with reports of 2 days of upper respiratory symptoms including shortness of breath, wheezing, body aches, headaches, chills, weakness/fatigue, dry cough with chest congestion, sinus congestion, rhinorrhea.  Reports that all of these symptoms are minor and the worst thing has been headache and chills.  Rates his overall symptom severity at a 3 out of 10.  His cough has been consistent through both day and night but is not severe.  Has not been taking anything aside from ibuprofen for his symptoms.  He did Covid test this morning and came back positive using a home test.  Denies fever, chest pain, GI symptoms.  Will need a letter for work.  He is not vaccinated for Covid.  Reports he does not like to take medications if he does not have to.  Past medical history, Surgical history, Family history not pertinant except as noted below, Social history, Allergies, and medications have been entered into the medical record, reviewed, and corrections made.   Review of Systems: See HPI for pertinent positives and negatives.   Objective:    General: Speaking clearly in complete sentences without any shortness of breath.  Alert and oriented x3.  Normal judgment. No apparent acute distress.  Impression and Recommendations:    1. COVID-19 virus infection Discussed symptomatic care.  Okay to continue Tylenol/ibuprofen and use over-the-counter medications to manage other  symptoms.  If over-the-counter medications do not work well, we will be happy to prescribe something for him.  Work note sent via [employer] but also mailing a paper copy to the home.  Patient name and information provided to the MAB infusions team.  Patient reports he would like more information but he is not likely to take the antibody infusion.   Return if symptoms worsen or fail to improve.  20 minutes of non-face-to-face time was provided during this encounter.  I discussed the assessment and treatment plan with the patient. The patient was provided an opportunity to ask questions and all were answered. The patient agreed with the plan and demonstrated an understanding of the instructions.   The patient was advised to call back or seek an in-person evaluation if the symptoms worsen or if the condition fails to improve as anticipated.  Clearnce Sorrel, DNP, APRN, FNP-BC [facility]

## 2020-01-15 NOTE — Telephone Encounter (Signed)
Left message on pt voicemail that I was returning his call regarding monoclonal antibody treatment for COVID-19.

## 2020-01-16 ENCOUNTER — Other Ambulatory Visit: Payer: Self-pay | Admitting: Nurse Practitioner

## 2020-01-16 DIAGNOSIS — K760 Fatty (change of) liver, not elsewhere classified: Secondary | ICD-10-CM

## 2020-01-16 DIAGNOSIS — Z720 Tobacco use: Secondary | ICD-10-CM

## 2020-01-16 DIAGNOSIS — R7301 Impaired fasting glucose: Secondary | ICD-10-CM

## 2020-01-16 DIAGNOSIS — I10 Essential (primary) hypertension: Secondary | ICD-10-CM

## 2020-01-16 DIAGNOSIS — U071 COVID-19: Secondary | ICD-10-CM

## 2020-01-16 NOTE — Progress Notes (Signed)
I connected by phone with Andre Henderson on 01/16/2020 at 4:14 PM to discuss the potential use of a new treatment for mild to moderate COVID-19 viral infection in non-hospitalized patients.  This patient is a 49 y.o. male that meets the FDA criteria for Emergency Use Authorization of COVID monoclonal antibody casirivimab/imdevimab or bamlanivimab/eteseviamb.  Has a (+) direct SARS-CoV-2 viral test result  Has mild or moderate COVID-19   Is NOT hospitalized due to COVID-19  Is within 10 days of symptom onset  Has at least one of the high risk factor(s) for progression to severe COVID-19 and/or hospitalization as defined in EUA.  Specific high risk criteria : BMI > 25, Cardiovascular disease or hypertension and Other high risk medical condition per CDC:  non-vaccinated, smoker   I have spoken and communicated the following to the patient or parent/caregiver regarding COVID monoclonal antibody treatment:  1. FDA has authorized the emergency use for the treatment of mild to moderate COVID-19 in adults and pediatric patients with positive results of direct SARS-CoV-2 viral testing who are 40 years of age and older weighing at least 40 kg, and who are at high risk for progressing to severe COVID-19 and/or hospitalization.  2. The significant known and potential risks and benefits of COVID monoclonal antibody, and the extent to which such potential risks and benefits are unknown.  3. Information on available alternative treatments and the risks and benefits of those alternatives, including clinical trials.  4. Patients treated with COVID monoclonal antibody should continue to self-isolate and use infection control measures (e.g., wear mask, isolate, social distance, avoid sharing personal items, clean and disinfect "high touch" surfaces, and frequent handwashing) according to CDC guidelines.   5. The patient or parent/caregiver has the option to accept or refuse COVID monoclonal antibody  treatment.  After reviewing this information with the patient, the patient has agreed to receive one of the available covid 19 monoclonal antibodies and will be provided an appropriate fact sheet prior to infusion. Orma Render, NP 01/16/2020 4:14 PM

## 2020-01-17 ENCOUNTER — Ambulatory Visit (HOSPITAL_COMMUNITY)
Admission: RE | Admit: 2020-01-17 | Discharge: 2020-01-17 | Disposition: A | Discharge status: Home / Self Care | Payer: 59 | Source: Ambulatory Visit | Attending: Pulmonary Disease | Admitting: Pulmonary Disease

## 2020-01-17 ENCOUNTER — Ambulatory Visit (HOSPITAL_COMMUNITY): Payer: 59

## 2020-01-17 DIAGNOSIS — I10 Essential (primary) hypertension: Secondary | ICD-10-CM | POA: Diagnosis present

## 2020-01-17 DIAGNOSIS — R7301 Impaired fasting glucose: Secondary | ICD-10-CM | POA: Diagnosis present

## 2020-01-17 DIAGNOSIS — K760 Fatty (change of) liver, not elsewhere classified: Secondary | ICD-10-CM | POA: Insufficient documentation

## 2020-01-17 DIAGNOSIS — Z720 Tobacco use: Secondary | ICD-10-CM | POA: Insufficient documentation

## 2020-01-17 DIAGNOSIS — U071 COVID-19: Secondary | ICD-10-CM | POA: Diagnosis present

## 2020-01-17 MED ORDER — ALBUTEROL SULFATE HFA 108 (90 BASE) MCG/ACT IN AERS: 2.0000 | INHALATION_SPRAY | Freq: Once | RESPIRATORY_TRACT | Status: DC | PRN

## 2020-01-17 MED ORDER — EPINEPHRINE 0.3 MG/0.3ML IJ SOAJ: 0.3000 mg | Freq: Once | INTRAMUSCULAR | Status: DC | PRN

## 2020-01-17 MED ORDER — DIPHENHYDRAMINE HCL 50 MG/ML IJ SOLN: 50.0000 mg | Freq: Once | INTRAMUSCULAR | Status: DC | PRN

## 2020-01-17 MED ORDER — METHYLPREDNISOLONE SODIUM SUCC 125 MG IJ SOLR: 125.0000 mg | Freq: Once | INTRAMUSCULAR | Status: DC | PRN

## 2020-01-17 MED ORDER — SODIUM CHLORIDE 0.9 % IV SOLN: Freq: Once | INTRAVENOUS | Status: AC

## 2020-01-17 MED ORDER — FAMOTIDINE IN NACL 20-0.9 MG/50ML-% IV SOLN: 20.0000 mg | Freq: Once | INTRAVENOUS | Status: DC | PRN

## 2020-01-17 MED ORDER — SODIUM CHLORIDE 0.9 % IV SOLN: INTRAVENOUS | Status: DC | PRN

## 2020-01-17 NOTE — Discharge Instructions (Signed)

## 2020-01-17 NOTE — Progress Notes (Signed)
  Diagnosis: COVID-19  Physician:Dr WRight  Procedure: Covid Infusion Clinic Med: bamlanivimab\etesevimab infusion - Provided patient with bamlanimivab\etesevimab fact sheet for patients, parents and caregivers prior to infusion.  Complications: No immediate complications noted.  Discharge: Discharged home   [place], [place] 01/17/2020

## 2020-01-21 ENCOUNTER — Encounter: Payer: Self-pay | Admitting: Medical-Surgical

## 2020-03-22 ENCOUNTER — Other Ambulatory Visit: Payer: Self-pay | Admitting: Family Medicine

## 2020-04-18 ENCOUNTER — Other Ambulatory Visit: Payer: Self-pay | Admitting: Family Medicine

## 2020-04-18 NOTE — Telephone Encounter (Signed)
Please call pt and let him know that he is overdue for f/u and labs.

## 2020-04-18 NOTE — Telephone Encounter (Signed)
Lvm for patient to call back to get this appt scheduled. AM

## 2020-04-24 ENCOUNTER — Other Ambulatory Visit: Payer: Self-pay | Admitting: Family Medicine

## 2020-05-22 ENCOUNTER — Other Ambulatory Visit: Payer: Self-pay | Admitting: Family Medicine

## 2020-05-22 NOTE — Telephone Encounter (Signed)
Appointment needed for refill.

## 2020-05-23 NOTE — Telephone Encounter (Signed)
LVM for patient to call back to get appt scheduled. Am

## 2020-05-25 ENCOUNTER — Other Ambulatory Visit: Payer: Self-pay | Admitting: Family Medicine

## 2020-06-24 ENCOUNTER — Other Ambulatory Visit: Payer: Self-pay | Admitting: Family Medicine

## 2020-07-20 ENCOUNTER — Other Ambulatory Visit: Payer: Self-pay | Admitting: Family Medicine

## 2020-07-24 ENCOUNTER — Other Ambulatory Visit: Payer: Self-pay | Admitting: Family Medicine

## 2020-07-28 ENCOUNTER — Encounter: Payer: Self-pay | Admitting: Family Medicine

## 2020-07-28 MED ORDER — ALLOPURINOL 300 MG PO TABS
300.0000 mg | ORAL_TABLET | Freq: Every day | ORAL | 0 refills | Status: DC
Start: 2020-07-28 — End: 2020-08-03

## 2020-08-03 MED ORDER — ALLOPURINOL 300 MG PO TABS
300.0000 mg | ORAL_TABLET | Freq: Every day | ORAL | 0 refills | Status: DC
Start: 2020-08-03 — End: 2020-10-13

## 2020-09-04 ENCOUNTER — Encounter: Payer: Self-pay | Admitting: Family Medicine

## 2020-09-15 ENCOUNTER — Ambulatory Visit: Payer: 59 | Admitting: Family Medicine

## 2020-09-26 ENCOUNTER — Ambulatory Visit: Payer: 59 | Admitting: Family Medicine

## 2020-10-10 ENCOUNTER — Ambulatory Visit: Payer: 59 | Admitting: Family Medicine

## 2020-10-10 ENCOUNTER — Encounter: Payer: Self-pay | Admitting: Family Medicine

## 2020-10-10 ENCOUNTER — Other Ambulatory Visit: Payer: Self-pay

## 2020-10-10 VITALS — BP 123/66 | HR 73 | Ht 70.0 in | Wt 208.0 lb

## 2020-10-10 DIAGNOSIS — R6882 Decreased libido: Secondary | ICD-10-CM

## 2020-10-10 DIAGNOSIS — K219 Gastro-esophageal reflux disease without esophagitis: Secondary | ICD-10-CM

## 2020-10-10 DIAGNOSIS — N529 Male erectile dysfunction, unspecified: Secondary | ICD-10-CM

## 2020-10-10 DIAGNOSIS — K760 Fatty (change of) liver, not elsewhere classified: Secondary | ICD-10-CM

## 2020-10-10 DIAGNOSIS — R7301 Impaired fasting glucose: Secondary | ICD-10-CM

## 2020-10-10 DIAGNOSIS — M1A0710 Idiopathic chronic gout, right ankle and foot, without tophus (tophi): Secondary | ICD-10-CM

## 2020-10-10 DIAGNOSIS — R5383 Other fatigue: Secondary | ICD-10-CM

## 2020-10-10 LAB — POCT GLYCOSYLATED HEMOGLOBIN (HGB A1C): Hemoglobin A1C: 6.2 % — AB (ref 4.0–5.6)

## 2020-10-10 MED ORDER — SILDENAFIL CITRATE 100 MG PO TABS: 50.0000 mg | ORAL_TABLET | Freq: Every day | ORAL | 11 refills | Status: DC | PRN

## 2020-10-10 NOTE — Assessment & Plan Note (Signed)
Due to recheck liver enzymes. 

## 2020-10-10 NOTE — Assessment & Plan Note (Signed)
A1c up just slightly from 6.0-6.2.  He did ask about cinnamon capsules which I think could be very helpful in lowering his glucose and continue to work on food choices and staying active and exercising regularly.

## 2020-10-10 NOTE — Assessment & Plan Note (Signed)
Discussed could try a different medication. Will try viagra.  Monitor for HA as well.

## 2020-10-10 NOTE — Assessment & Plan Note (Signed)
Recheck uric acid.  Continue allopurinol.

## 2020-10-10 NOTE — Progress Notes (Signed)
Established Patient Office Visit  Subjective:  Patient ID: Andre Henderson, male    DOB: 1971-02-27  Age: 50 y.o. MRN: [id number]  CC:  Chief Complaint  Patient presents with   Hypertension   ifg    HPI Andre Henderson presents for follow-up.  I have not seen him in almost 2 years he said unfortunately he had a cousin passed from pancreatic cancer and it just really hit him hard and he just kind of got off track with self-care but says he is back wanting to get back on track with everything.  Impaired fasting glucose-no increased thirst or urination. No symptoms consistent with hypoglycemia.  F/U Gout -  no recent flares or exacerbations.  He has been doing well on allopurinol. He has been trying to take it every other day.    GERD - Taking the Prilosec every other day.    ED - says the Cialis would cause headaches and tension in the back of the head a day or two after taking it. He did feel that it helped stop taking it because of headaches.  He has felt more fatigued and has noticed a decrease in his libido and wonders if it could be related to low testosterone.  Past Medical History:  Diagnosis Date   Arthritis    Colitis 2002   Gout     Past Surgical History:  Procedure Laterality Date   ANKLE FRACTURE SURGERY Right     Family History  Problem Relation Age of Onset   Diabetes Mother    Hyperlipidemia Mother    Hypertension Mother    Diabetes Paternal Uncle    Crohn's disease Cousin        2nd cousin   Pancreatic cancer Cousin    Colon cancer Neg Hx    Esophageal cancer Neg Hx     Social History   Socioeconomic History   Marital status: Single    Spouse name: [name]   Number of children: Not on file   Years of education: Not on file   Highest education level: Not on file  Occupational History   Occupation: Tree surgeon    Employer: [employer]  Tobacco Use   Smoking status: Light Smoker    Packs/day: 0.00    Pack years: 0.00    Types:  Cigars, Cigarettes   Smokeless tobacco: Never   Tobacco comments:    One cigar per month.   Vaping Use   Vaping Use: Never used  Substance and Sexual Activity   Alcohol use: Yes    Alcohol/week: 10.0 standard drinks    Types: 10 Cans of beer per week   Drug use: No   Sexual activity: Yes    Partners: Female  Other Topics Concern   Not on file  Social History Narrative   1-2 sodas per day. Some regular exercise. Does smoke a cigar from time to time. 10 beers per week. He currently lives with [name] currently works at [employer].     Social Determinants of Health   Financial Resource Strain: Not on file  Food Insecurity: Not on file  Transportation Needs: Not on file  Physical Activity: Not on file  Stress: Not on file  Social Connections: Not on file  Intimate Partner Violence: Not on file    Outpatient Medications Prior to Visit  Medication Sig Dispense Refill   allopurinol (ZYLOPRIM) 300 MG tablet Take 1 tablet (300 mg total) by mouth daily. 30 tablet 0  omeprazole (PRILOSEC) 10 MG capsule Take 10 mg by mouth once a week.      triamcinolone cream (KENALOG) 0.1 % Apply 1 application topically 2 (two) times daily. 60 g 0   Facility-Administered Medications Prior to Visit  Medication Dose Route Frequency Provider Last Rate Last Admin   0.9 %  sodium chloride infusion  500 mL Intravenous Once Nandigam, Venia Minks, MD        No Known Allergies  ROS Review of Systems    Objective:    Physical Exam Constitutional:      Appearance: He is well-developed.  HENT:     Head: Normocephalic and atraumatic.     Right Ear: External ear normal.     Left Ear: External ear normal.  Eyes:     Conjunctiva/sclera: Conjunctivae normal.  Neck:     Thyroid: No thyromegaly.  Cardiovascular:     Rate and Rhythm: Normal rate and regular rhythm.     Heart sounds: Normal heart sounds.  Pulmonary:     Effort: Pulmonary effort is normal.     Breath sounds: Normal  breath sounds.  Skin:    General: Skin is warm and dry.  Neurological:     Mental Status: He is alert and oriented to person, place, and time.     Deep Tendon Reflexes: Reflexes are normal and symmetric.  Psychiatric:        Behavior: Behavior normal.        Thought Content: Thought content normal.        Judgment: Judgment normal.    BP 123/66   Pulse 73   Ht 5\' 10"  (1.778 m)   Wt 208 lb (94.3 kg)   SpO2 97%   BMI 29.84 kg/m  Wt Readings from Last 3 Encounters:  10/10/20 208 lb (94.3 kg)  12/31/19 217 lb (98.4 kg)  01/19/19 205 lb (93 kg)     Health Maintenance Due  Topic Date Due   Zoster Vaccines- Shingrix (1 of 2) Never done    There are no preventive care reminders to display for this patient.  Lab Results  Component Value Date   TSH 1.06 04/20/2018   Lab Results  Component Value Date   WBC 8.0 04/20/2018   HGB 15.4 04/20/2018   HCT 44.7 04/20/2018   MCV 91.0 04/20/2018   PLT 321 04/20/2018   Lab Results  Component Value Date   NA 141 04/20/2018   K 4.9 04/20/2018   CO2 30 04/20/2018   GLUCOSE 105 (H) 04/20/2018   BUN 16 04/20/2018   CREATININE 0.96 04/20/2018   BILITOT 0.3 04/20/2018   ALKPHOS 52 09/06/2016   AST 23 04/20/2018   ALT 42 04/20/2018   PROT 7.0 04/20/2018   ALBUMIN 4.2 09/06/2016   CALCIUM 10.2 04/20/2018   Lab Results  Component Value Date   CHOL 196 04/20/2018   Lab Results  Component Value Date   HDL 47 04/20/2018   Lab Results  Component Value Date   LDLCALC 123 (H) 04/20/2018   Lab Results  Component Value Date   TRIG 148 04/20/2018   Lab Results  Component Value Date   CHOLHDL 4.2 04/20/2018   Lab Results  Component Value Date   HGBA1C 6.2 (A) 10/10/2020      Assessment & Plan:   Problem List Items Addressed This Visit       Digestive   Gastroesophageal reflux disease without esophagitis    Doing well with every other day dosing.  Relevant Orders   CBC   COMPLETE METABOLIC PANEL WITH GFR    Lipid panel   PSA   Uric acid   Testosterone Total,Free,Bio, Males   Fatty liver    Due to recheck liver enzymes.       Relevant Orders   CBC   COMPLETE METABOLIC PANEL WITH GFR   Lipid panel   PSA   Uric acid   Testosterone Total,Free,Bio, Males     Endocrine   IFG (impaired fasting glucose) - Primary    A1c up just slightly from 6.0-6.2.  He did ask about cinnamon capsules which I think could be very helpful in lowering his glucose and continue to work on food choices and staying active and exercising regularly.       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       Erectile dysfunction    Discussed could try a different medication. Will try viagra.  Monitor for HA as well.       Relevant Medications   sildenafil (VIAGRA) 100 MG tablet   Other Relevant Orders   Testosterone Total,Free,Bio, Males   Other Visit Diagnoses     Fatigue, unspecified type       Relevant Orders   CBC   COMPLETE METABOLIC PANEL WITH GFR   Lipid panel   PSA   Uric acid   Testosterone Total,Free,Bio, Males       Meds ordered this encounter  Medications   sildenafil (VIAGRA) 100 MG tablet    Sig: Take 0.5-1 tablets (50-100 mg total) by mouth daily as needed for erectile dysfunction.    Dispense:  5 tablet    Refill:  11     Follow-up: Return in about 1 year (around 10/10/2021).    Beatrice Lecher, MD

## 2020-10-10 NOTE — Assessment & Plan Note (Signed)
Will check testosterone levels but he will need to come back in the morning to have those drawn in the a.m.

## 2020-10-10 NOTE — Assessment & Plan Note (Signed)
Doing well with every other day dosing.

## 2020-10-13 ENCOUNTER — Other Ambulatory Visit: Payer: Self-pay | Admitting: Family Medicine

## 2020-10-19 LAB — CBC
HCT: 48.4 % (ref 38.5–50.0)
Hemoglobin: 16 g/dL (ref 13.2–17.1)
MCH: 31.3 pg (ref 27.0–33.0)
MCHC: 33.1 g/dL (ref 32.0–36.0)
MCV: 94.5 fL (ref 80.0–100.0)
MPV: 10.8 fL (ref 7.5–12.5)
Platelets: 269 10*3/uL (ref 140–400)
RBC: 5.12 10*6/uL (ref 4.20–5.80)
RDW: 13.3 % (ref 11.0–15.0)
WBC: 7.2 10*3/uL (ref 3.8–10.8)

## 2020-10-19 LAB — LIPID PANEL
Cholesterol: 186 mg/dL (ref <200)
HDL: 46 mg/dL (ref >=40)
LDL Cholesterol (Calc): 107 mg/dL (calc) — ABNORMAL HIGH
Non-HDL Cholesterol (Calc): 140 mg/dL (calc) — ABNORMAL HIGH (ref <130)
Total CHOL/HDL Ratio: 4 (calc) (ref <5.0)
Triglycerides: 209 mg/dL — ABNORMAL HIGH (ref <150)

## 2020-10-19 LAB — COMPLETE METABOLIC PANEL WITH GFR
AG Ratio: 1.9 (calc) (ref 1.0–2.5)
ALT: 42 U/L (ref 9–46)
AST: 22 U/L (ref 10–35)
Albumin: 4.6 g/dL (ref 3.6–5.1)
Alkaline phosphatase (APISO): 54 U/L (ref 35–144)
BUN: 17 mg/dL (ref 7–25)
CO2: 28 mmol/L (ref 20–32)
Calcium: 9.7 mg/dL (ref 8.6–10.3)
Chloride: 103 mmol/L (ref 98–110)
Creat: 1.04 mg/dL (ref 0.70–1.30)
Globulin: 2.4 g/dL (calc) (ref 1.9–3.7)
Glucose, Bld: 137 mg/dL — ABNORMAL HIGH (ref 65–99)
Potassium: 4.7 mmol/L (ref 3.5–5.3)
Sodium: 139 mmol/L (ref 135–146)
Total Bilirubin: 0.4 mg/dL (ref 0.2–1.2)
Total Protein: 7 g/dL (ref 6.1–8.1)
eGFR: 86 mL/min/{1.73_m2} (ref >=60)

## 2020-10-19 LAB — TESTOSTERONE TOTAL,FREE,BIO, MALES
Albumin: 4.6 g/dL (ref 3.6–5.1)
Sex Hormone Binding: 21 nmol/L (ref 10–50)
Testosterone, Bioavailable: 119.5 ng/dL (ref 110.0–575.0)
Testosterone, Free: 56.9 pg/mL (ref 46.0–224.0)
Testosterone: 319 ng/dL (ref 250–827)

## 2020-10-19 LAB — PSA: PSA: 0.97 ng/mL (ref <=4.00)

## 2020-10-19 LAB — URIC ACID: Uric Acid, Serum: 6.9 mg/dL (ref 4.0–8.0)

## 2020-11-20 ENCOUNTER — Other Ambulatory Visit: Payer: Self-pay | Admitting: Family Medicine

## 2020-11-22 ENCOUNTER — Encounter: Payer: Self-pay | Admitting: Family Medicine

## 2020-11-22 ENCOUNTER — Other Ambulatory Visit: Payer: Self-pay | Admitting: Family Medicine

## 2020-11-24 ENCOUNTER — Telehealth: Payer: Self-pay | Admitting: Family Medicine

## 2020-11-24 DIAGNOSIS — R7989 Other specified abnormal findings of blood chemistry: Secondary | ICD-10-CM

## 2020-11-24 NOTE — Telephone Encounter (Signed)
Pt called.  He wants to have labs drawn to recheck his testosterone levels.  Thank you

## 2020-11-27 NOTE — Telephone Encounter (Signed)
Orders Placed This Encounter  Procedures   Testosterone Total,Free,Bio, Males   Go in AM before 10 AM

## 2020-11-28 NOTE — Telephone Encounter (Signed)
VM box is full and cannot accept messages at this time.  Will attempt to reach pt again.  Charyl Bigger, CMA

## 2021-01-02 NOTE — Telephone Encounter (Signed)
Called patient to notify of lab order being placed. Unable to reach patient, but left a voicemail with instructions to come in any morning before 10am for the recheck.

## 2021-07-05 ENCOUNTER — Other Ambulatory Visit: Payer: Self-pay | Admitting: Family Medicine

## 2021-07-09 ENCOUNTER — Encounter: Payer: Self-pay | Admitting: Family Medicine

## 2021-07-25 ENCOUNTER — Emergency Department (INDEPENDENT_AMBULATORY_CARE_PROVIDER_SITE_OTHER)
Admission: EM | Admit: 2021-07-25 | Discharge: 2021-07-25 | Disposition: A | Discharge status: Home / Self Care | Payer: 59 | Source: Home / Self Care

## 2021-07-25 DIAGNOSIS — L739 Follicular disorder, unspecified: Secondary | ICD-10-CM

## 2021-07-25 MED ORDER — DOXYCYCLINE HYCLATE 100 MG PO CAPS: 100.0000 mg | ORAL_CAPSULE | Freq: Two times a day (BID) | ORAL | 0 refills | Status: AC

## 2021-07-25 NOTE — Discharge Instructions (Addendum)
Advised patient to take medication as directed with food to completion.  Encouraged patient to increase daily water intake while taking this medication.  Advised patient if symptoms worsen and/or unresolved please follow-up with PCP or here for further evaluation. 

## 2021-07-25 NOTE — ED Provider Notes (Signed)
?[facility] ? ? ? ?CSN: [id number] ?Arrival date & time: 07/25/21  1332 ? ? ?  ? ?History   ?Chief Complaint ?Chief Complaint  ?Patient presents with  ? Wound Infection  ?  Lt nipple  ? ? ?HPI ?Andre Henderson is a 51 y.o. male.  ? ?HPI 51 year old male presents with possible infection of left nipple for one month. ? ?Past Medical History:  ?Diagnosis Date  ? Arthritis   ? Colitis 2002  ? Gout   ? ? ?Patient Active Problem List  ? Diagnosis Date Noted  ? Erectile dysfunction 10/10/2020  ? Low libido 10/10/2020  ? Cervical radiculopathy at C6 10/27/2018  ? Gastroesophageal reflux disease without esophagitis 10/20/2017  ? Fatty liver 05/16/2017  ? IFG (impaired fasting glucose) 11/19/2013  ? Gout 10/15/2013  ? Tobacco abuse 10/15/2013  ? 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 ? ?Past Surgical History:  ?Procedure Laterality Date  ? ANKLE FRACTURE SURGERY Right   ? ? ? ? ? ?Home Medications   ? ?Prior to Admission medications   ?Medication Sig Start Date End Date Taking? Authorizing Provider  ?doxycycline (VIBRAMYCIN) 100 MG capsule Take 1 capsule (100 mg total) by mouth 2 (two) times daily for 10 days. 07/25/21 08/04/21 Yes Eliezer Lofts, FNP  ?allopurinol (ZYLOPRIM) 300 MG tablet TAKE ONE TABLET BY MOUTH ONE TIME DAILY 07/10/21   Hali Marry, MD  ?omeprazole (PRILOSEC) 10 MG capsule Take 10 mg by mouth once a week.     [provider]  ?sildenafil (VIAGRA) 100 MG tablet Take 0.5-1 tablets (50-100 mg total) by mouth daily as needed for erectile dysfunction. 10/10/20   Hali Marry, MD  ?triamcinolone cream (KENALOG) 0.1 % Apply 1 application topically 2 (two) times daily. 12/31/19   Scot Jun, FNP  ? ? ?Family History ?Family History  ?Problem Relation Age of Onset  ? Diabetes Mother   ? Hyperlipidemia Mother   ? Hypertension Mother   ? Diabetes Paternal Uncle   ? Crohn's disease Cousin   ?     2nd cousin  ? Pancreatic cancer Cousin   ? Colon cancer Neg  Hx   ? Esophageal cancer Neg Hx   ? ? ?Social History ?Social History  ? ?Tobacco Use  ? Smoking status: Light Smoker  ?  Packs/day: 0.00  ?  Types: Cigars, Cigarettes  ? Smokeless tobacco: Never  ? Tobacco comments:  ?  One cigar per month.   ?Vaping Use  ? Vaping Use: Never used  ?Substance Use Topics  ? Alcohol use: Yes  ?  Alcohol/week: 10.0 standard drinks  ?  Types: 10 Cans of beer per week  ? Drug use: No  ? ? ? ?Allergies   ?Patient has no known allergies. ? ? ?Review of Systems ?Review of Systems  ?Skin:  Positive for rash.  ? ? ?Physical Exam ?Triage Vital Signs ?ED Triage Vitals [07/25/21 1342]  ?Enc Vitals Group  ?   BP (!) 142/90  ?   Pulse Rate 79  ?   Resp 17  ?   Temp 98.5 ?F (36.9 ?C)  ?   Temp Source Oral  ?   SpO2 98 %  ?   Weight   ?   Height   ?   Head Circumference   ?   Peak Flow   ?   Pain Score 0  ?   Pain Loc   ?   Pain Edu?   ?  Excl. in [place]?   ? ?No data found. ? ?Updated Vital Signs ?BP (!) 142/90 (BP Location: Left Arm)   Pulse 79   Temp 98.5 ?F (36.9 ?C) (Oral)   Resp 17   SpO2 98%  ? ? ? ?Physical Exam ?Vitals and nursing note reviewed.  ?Constitutional:   ?   Appearance: Normal appearance. He is normal weight.  ?HENT:  ?   Head: Normocephalic and atraumatic.  ?   Mouth/Throat:  ?   Mouth: Mucous membranes are moist.  ?   Pharynx: Oropharynx is clear.  ?Eyes:  ?   Extraocular Movements: Extraocular movements intact.  ?   Conjunctiva/sclera: Conjunctivae normal.  ?   Pupils: Pupils are equal, round, and reactive to light.  ?Cardiovascular:  ?   Rate and Rhythm: Normal rate and regular rhythm.  ?   Pulses: Normal pulses.  ?   Heart sounds: Normal heart sounds.  ?Pulmonary:  ?   Effort: Pulmonary effort is normal.  ?   Breath sounds: Normal breath sounds. No wheezing, rhonchi or rales.  ?Musculoskeletal:  ?   Cervical back: Normal range of motion and neck supple.  ?Skin: ?   General: Skin is warm and dry.  ?   Comments: Left center of nipple: Tiny (<3 mm) erythematous pustular lesion  noted, no discharge, no drainage, non-indurated, no lymphatic streaking  ?Neurological:  ?   General: No focal deficit present.  ?   Mental Status: He is alert and oriented to person, place, and time.  ? ? ? ?UC Treatments / Results  ?Labs ?(all labs ordered are listed, but only abnormal results are displayed) ?Labs Reviewed - No data to display ? ?EKG ? ? ?Radiology ?No results found. ? ?Procedures ?Procedures (including critical care time) ? ?Medications Ordered in UC ?Medications - No data to display ? ?Initial Impression / Assessment and Plan / UC Course  ?I have reviewed the triage vital signs and the nursing notes. ? ?Pertinent labs & imaging results that were available during my care of the patient were reviewed by me and considered in my medical decision making (see chart for details). ? ?  ? ?MDM: 1.  Folliculitis-Rx'd Doxycycline. Advised patient to take medication as directed with food to completion.  Encouraged patient to increase daily water intake while taking this medication.  Advised patient if symptoms worsen and/or unresolved please follow-up with PCP or here for further evaluation.  Patient discharged home, hemodynamically stable. ? ?Final Clinical Impressions(s) / UC Diagnoses  ? ?Final diagnoses:  ?Folliculitis  ? ? ? ?Discharge Instructions   ? ?  ?Advised patient to take medication as directed with food to completion.  Encouraged patient to increase daily water intake while taking this medication.  Advised patient if symptoms worsen and/or unresolved please follow-up with PCP or here for further evaluation. ? ? ? ? ?ED Prescriptions   ? ? Medication Sig Dispense Auth. Provider  ? doxycycline (VIBRAMYCIN) 100 MG capsule Take 1 capsule (100 mg total) by mouth 2 (two) times daily for 10 days. 20 capsule Eliezer Lofts, FNP  ? ?  ? ?PDMP not reviewed this encounter. ?  ?Eliezer Lofts, [place] ?07/25/21 1429 ? ?

## 2021-07-25 NOTE — ED Triage Notes (Signed)
Pt c/o possibly LT infected nipple. Says started as a small pimple about a month ago. Tender to touch. Sometimes gets bigger but no discharge. ?

## 2021-09-14 ENCOUNTER — Encounter: Payer: Self-pay | Admitting: Emergency Medicine

## 2021-09-14 ENCOUNTER — Emergency Department (INDEPENDENT_AMBULATORY_CARE_PROVIDER_SITE_OTHER)
Admission: EM | Admit: 2021-09-14 | Discharge: 2021-09-14 | Disposition: A | Discharge status: Home / Self Care | Payer: 59 | Source: Home / Self Care

## 2021-09-14 DIAGNOSIS — M25511 Pain in right shoulder: Secondary | ICD-10-CM

## 2021-09-14 MED ORDER — DICLOFENAC SODIUM 50 MG PO TBEC: 50.0000 mg | DELAYED_RELEASE_TABLET | Freq: Two times a day (BID) | ORAL | 0 refills | Status: DC

## 2021-09-14 NOTE — ED Provider Notes (Signed)
[name]    CSN: [id number] Arrival date & time: 09/14/21  1807      History   Chief Complaint Chief Complaint  Patient presents with   Shoulder Pain    HPI Andre Henderson is a 51 y.o. male.   Patient presents with concerns of right shoulder pain. He reports he noticed it Tuesday when he woke up. He declines any known injury or recent change or increase in activity. He states he was hoping it would get better on it's own but it hasn't. He reports some dull constant pain but mainly pain that is more sharp/stabbing with certain movements. He reports lifting his arm, especially out to the side, or trying to reach behind him cause this sharp pain. He occasionally has some discomfort in the upper arm but the pain usually stays in the front of his shoulder. He is right-handed. The patient denies history of shoulder problems except an injury when he was a teenager.   The history is provided by the patient.  Shoulder Pain Associated symptoms: no fever and no neck pain     Past Medical History:  Diagnosis Date   Arthritis    Colitis 2002   Gout     Patient Active Problem List   Diagnosis Date Noted   Erectile dysfunction 10/10/2020   Low libido 10/10/2020   Cervical radiculopathy at C6 10/27/2018   Gastroesophageal reflux disease without esophagitis 10/20/2017   Fatty liver 05/16/2017   IFG (impaired fasting glucose) 11/19/2013   Gout 10/15/2013   Tobacco abuse 10/15/2013   Idiopathic chronic gout of right foot without tophus 10/15/2013    Past Surgical History:  Procedure Laterality Date   ANKLE FRACTURE SURGERY Right        Home Medications    Prior to Admission medications   Medication Sig Start Date End Date Taking? Authorizing Provider  diclofenac (VOLTAREN) 50 MG EC tablet Take 1 tablet (50 mg total) by mouth 2 (two) times daily. 09/14/21  Yes Donnita Farina L, PA  allopurinol (ZYLOPRIM) 300 MG tablet TAKE ONE TABLET BY MOUTH ONE TIME DAILY 07/10/21    Hali Marry, MD  omeprazole (PRILOSEC) 10 MG capsule Take 10 mg by mouth once a week.     [provider]  sildenafil (VIAGRA) 100 MG tablet Take 0.5-1 tablets (50-100 mg total) by mouth daily as needed for erectile dysfunction. 10/10/20   Hali Marry, MD  triamcinolone cream (KENALOG) 0.1 % Apply 1 application topically 2 (two) times daily. Patient not taking: Reported on 09/14/2021 12/31/19   Scot Jun, FNP    Family History Family History  Problem Relation Age of Onset   Diabetes Mother    Hyperlipidemia Mother    Hypertension Mother    Diabetes Paternal Uncle    Crohn's disease Cousin        2nd cousin   Pancreatic cancer Cousin    Colon cancer Neg Hx    Esophageal cancer Neg Hx     Social History Social History   Tobacco Use   Smoking status: Light Smoker    Packs/day: 0.00    Types: Cigars, Cigarettes   Smokeless tobacco: Never   Tobacco comments:    One cigar per month.   Vaping Use   Vaping Use: Never used  Substance Use Topics   Alcohol use: Yes    Alcohol/week: 10.0 standard drinks of alcohol    Types: 10 Cans of beer per week   Drug use: No  Allergies   Patient has no known allergies.   Review of Systems Review of Systems  Constitutional:  Negative for fever.  Cardiovascular:  Negative for chest pain.  Musculoskeletal:  Positive for arthralgias. Negative for joint swelling and neck pain.  Skin:  Negative for color change and rash.  Neurological:  Negative for numbness.     Physical Exam Triage Vital Signs ED Triage Vitals  Enc Vitals Group     BP 09/14/21 1816 (!) 166/82     Pulse Rate 09/14/21 1816 80     Resp 09/14/21 1816 14     Temp 09/14/21 1816 99 F (37.2 C)     Temp Source 09/14/21 1816 Oral     SpO2 09/14/21 1816 98 %     Weight --      Height --      Head Circumference --      Peak Flow --      Pain Score 09/14/21 1818 3     Pain Loc --      Pain Edu? --      Excl. in [place]? --    No  data found.  Updated Vital Signs BP (!) 166/82 (BP Location: Left Arm)   Pulse 80   Temp 99 F (37.2 C) (Oral)   Resp 14   SpO2 98%   Visual Acuity Right Eye Distance:   Left Eye Distance:   Bilateral Distance:    Right Eye Near:   Left Eye Near:    Bilateral Near:     Physical Exam Vitals and nursing note reviewed.  Constitutional:      General: He is not in acute distress. HENT:     Head: Normocephalic.  Cardiovascular:     Rate and Rhythm: Normal rate and regular rhythm.     Heart sounds: Normal heart sounds.  Pulmonary:     Effort: Pulmonary effort is normal.     Breath sounds: Normal breath sounds.  Musculoskeletal:     Right shoulder: No tenderness. Decreased range of motion. Normal pulse.     Comments: No notable tenderness to R shoulder. Decreased and painful ROM. Flexion full but antalgic past 90. Abduction limited to about 90. Pain with external rotation and reaching behind his back. Some pain with crossover test as well.   Neurological:     Mental Status: He is alert.  Psychiatric:        Mood and Affect: Mood normal.      UC Treatments / Results  Labs (all labs ordered are listed, but only abnormal results are displayed) Labs Reviewed - No data to display  EKG   Radiology No results found.  Procedures Procedures (including critical care time)  Medications Ordered in UC Medications - No data to display  Initial Impression / Assessment and Plan / UC Course  I have reviewed the triage vital signs and the nursing notes.  Pertinent labs & imaging results that were available during my care of the patient were reviewed by me and considered in my medical decision making (see chart for details).     Suspect degenerative change, possibly secondary to prior injury to the shoulder as a teenager accelerating arthritis/degeneration. NSAID and recommend follow-up with ortho if minimal improvement.   E/M: 1 acute uncomplicated illness, no data, moderate  risk due to prescription management  Final Clinical Impressions(s) / UC Diagnoses   Final diagnoses:  Acute pain of right shoulder     Discharge Instructions  Suspect degenerative change such as rotator cuff or impingement. Take diclofenac as prescribed. Do gentle range of motion exercises to prevent stiffness but avoid heavy lifting or activities that worsen the pain. Follow-up with ortho if minimal improvement in a week.     ED Prescriptions     Medication Sig Dispense Auth. Provider   diclofenac (VOLTAREN) 50 MG EC tablet Take 1 tablet (50 mg total) by mouth 2 (two) times daily. 10 tablet Abner Greenspan, Keyaria Lawson L, PA      PDMP not reviewed this encounter.   Delsa Sale, [place] 09/14/21 1836

## 2021-09-14 NOTE — ED Triage Notes (Signed)
Pt presents with c/o rt shoulder pain. NKI. Pt states pain began Monday night.

## 2021-09-14 NOTE — Discharge Instructions (Signed)
Suspect degenerative change such as rotator cuff or impingement. Take diclofenac as prescribed. Do gentle range of motion exercises to prevent stiffness but avoid heavy lifting or activities that worsen the pain. Follow-up with ortho if minimal improvement in a week.

## 2021-12-21 ENCOUNTER — Encounter: Payer: Self-pay | Admitting: Gastroenterology

## 2021-12-28 ENCOUNTER — Encounter: Payer: Self-pay | Admitting: Family Medicine

## 2021-12-28 NOTE — Telephone Encounter (Signed)
Continue to monitor.  Can try warm compress to the area.  If Worsening recommend urgent care over the weekend or keep follow up with PCP early next week.

## 2022-01-01 ENCOUNTER — Ambulatory Visit (AMBULATORY_SURGERY_CENTER): Payer: Self-pay

## 2022-01-01 ENCOUNTER — Encounter: Payer: Self-pay | Admitting: Gastroenterology

## 2022-01-01 VITALS — Ht 70.0 in | Wt 215.0 lb

## 2022-01-01 DIAGNOSIS — Z86010 Personal history of colonic polyps: Secondary | ICD-10-CM

## 2022-01-01 MED ORDER — NA SULFATE-K SULFATE-MG SULF 17.5-3.13-1.6 GM/177ML PO SOLN: 1.0000 | Freq: Once | ORAL | 0 refills | Status: AC

## 2022-01-01 NOTE — Progress Notes (Signed)
No egg or soy allergy known to patient  No issues known to pt with past sedation with any surgeries or procedures Patient denies ever being told they had issues or difficulty with intubation  No FH of Malignant Hyperthermia Pt is not on diet pills Pt is not on home 02  Pt is not on blood thinners  Pt denies issues with constipation  No A fib or A flutter Have any cardiac testing pending--NO Pt instructed to use [email or web address] or GoodRx for a price reduction on prep   

## 2022-01-07 ENCOUNTER — Encounter: Payer: Self-pay | Admitting: Certified Registered Nurse Anesthetist

## 2022-01-15 ENCOUNTER — Ambulatory Visit (AMBULATORY_SURGERY_CENTER): Payer: 59 | Admitting: Gastroenterology

## 2022-01-15 ENCOUNTER — Encounter: Payer: Self-pay | Admitting: Gastroenterology

## 2022-01-15 VITALS — BP 119/75 | HR 63 | Temp 98.5°F | Resp 13 | Ht 70.0 in | Wt 215.0 lb

## 2022-01-15 DIAGNOSIS — K51011 Ulcerative (chronic) pancolitis with rectal bleeding: Secondary | ICD-10-CM | POA: Diagnosis not present

## 2022-01-15 DIAGNOSIS — Z09 Encounter for follow-up examination after completed treatment for conditions other than malignant neoplasm: Secondary | ICD-10-CM

## 2022-01-15 DIAGNOSIS — Z86010 Personal history of colonic polyps: Secondary | ICD-10-CM

## 2022-01-15 DIAGNOSIS — K5150 Left sided colitis without complications: Secondary | ICD-10-CM | POA: Diagnosis not present

## 2022-01-15 DIAGNOSIS — K529 Noninfective gastroenteritis and colitis, unspecified: Secondary | ICD-10-CM | POA: Diagnosis not present

## 2022-01-15 MED ORDER — SODIUM CHLORIDE 0.9 % IV SOLN: 500.0000 mL | Freq: Once | INTRAVENOUS | Status: DC

## 2022-01-15 NOTE — Progress Notes (Signed)
Called to room to assist during endoscopic procedure.  Patient ID and intended procedure confirmed with present staff. Received instructions for my participation in the procedure from the performing physician.  

## 2022-01-15 NOTE — Progress Notes (Unsigned)
Report given to PACU, vss 

## 2022-01-15 NOTE — Patient Instructions (Signed)
Please read handouts provided. Continue present medications. Await pathology results. Repeat colonoscopy in 1 year. Return to GI clinic at the next available appointment.   YOU HAD AN ENDOSCOPIC PROCEDURE TODAY AT [facility]:   Refer to the procedure report that was given to you for any specific questions about what was found during the examination.  If the procedure report does not answer your questions, please call your gastroenterologist to clarify.  If you requested that your care partner not be given the details of your procedure findings, then the procedure report has been included in a sealed envelope for you to review at your convenience later.  YOU SHOULD EXPECT: Some feelings of bloating in the abdomen. Passage of more gas than usual.  Walking can help get rid of the air that was put into your GI tract during the procedure and reduce the bloating. If you had a lower endoscopy (such as a colonoscopy or flexible sigmoidoscopy) you may notice spotting of blood in your stool or on the toilet paper. If you underwent a bowel prep for your procedure, you may not have a normal bowel movement for a few days.  Please Note:  You might notice some irritation and congestion in your nose or some drainage.  This is from the oxygen used during your procedure.  There is no need for concern and it should clear up in a day or so.  SYMPTOMS TO REPORT IMMEDIATELY:  Following lower endoscopy (colonoscopy or flexible sigmoidoscopy):  Excessive amounts of blood in the stool  Significant tenderness or worsening of abdominal pains  Swelling of the abdomen that is new, acute  Fever of 100F or higher.  For urgent or emergent issues, a gastroenterologist can be reached at any hour by calling [phone]. Do not use MyChart messaging for urgent concerns.    DIET:  We do recommend a small meal at first, but then you may proceed to your regular diet.  Drink plenty of fluids but you should  avoid alcoholic beverages for 24 hours.  ACTIVITY:  You should plan to take it easy for the rest of today and you should NOT DRIVE or use heavy machinery until tomorrow (because of the sedation medicines used during the test).    FOLLOW UP: Our staff will call the number listed on your records the next business day following your procedure.  We will call around 7:15- 8:00 am to check on you and address any questions or concerns that you may have regarding the information given to you following your procedure. If we do not reach you, we will leave a message.     If any biopsies were taken you will be contacted by phone or by letter within the next 1-3 weeks.  Please call us at [phone] if you have not heard about the biopsies in 3 weeks.    SIGNATURES/CONFIDENTIALITY: You and/or your care partner have signed paperwork which will be entered into your electronic medical record.  These signatures attest to the fact that that the information above on your After Visit Summary has been reviewed and is understood.  Full responsibility of the confidentiality of this discharge information lies with you and/or your care-partner.

## 2022-01-15 NOTE — Progress Notes (Unsigned)
Vitals-DT  Pt's states no medical or surgical changes since previsit or office visit.  

## 2022-01-15 NOTE — Progress Notes (Unsigned)
[facility] History and Physical   Primary Care Physician:  Hali Marry, MD   Reason for Procedure:  History of adenomatous colon polyps  Plan:    Surveillance colonoscopy with possible interventions as needed     HPI: Andre Henderson is a very pleasant 51 y.o. male here for surveillance colonoscopy. Denies any nausea, vomiting, abdominal pain, melena or bright red blood per rectum  The risks and benefits as well as alternatives of endoscopic procedure(s) have been discussed and reviewed. All questions answered. The patient agrees to proceed.    Past Medical History:  Diagnosis Date   Arthritis    Colitis 2002   Gout     Past Surgical History:  Procedure Laterality Date   ANKLE FRACTURE SURGERY Right 2005   COLONOSCOPY  2020   COLONOSCOPY  2002    Prior to Admission medications   Medication Sig Start Date End Date Taking? Authorizing Provider  allopurinol (ZYLOPRIM) 300 MG tablet TAKE ONE TABLET BY MOUTH ONE TIME DAILY 07/10/21  Yes Hali Marry, MD  omeprazole (PRILOSEC) 10 MG capsule Take 10 mg by mouth once a week.    Yes [provider]  diclofenac (VOLTAREN) 50 MG EC tablet Take 1 tablet (50 mg total) by mouth 2 (two) times daily. 09/14/21   Abner Greenspan, Amy L, PA  sildenafil (VIAGRA) 100 MG tablet Take 0.5-1 tablets (50-100 mg total) by mouth daily as needed for erectile dysfunction. Patient not taking: Reported on 01/01/2022 10/10/20   Hali Marry, MD  triamcinolone cream (KENALOG) 0.1 % Apply 1 application topically 2 (two) times daily. 12/31/19   Scot Jun, FNP    Current Outpatient Medications  Medication Sig Dispense Refill   allopurinol (ZYLOPRIM) 300 MG tablet TAKE ONE TABLET BY MOUTH ONE TIME DAILY 90 tablet 1   omeprazole (PRILOSEC) 10 MG capsule Take 10 mg by mouth once a week.      diclofenac (VOLTAREN) 50 MG EC tablet Take 1 tablet (50 mg total) by mouth 2 (two) times daily. 10 tablet 0   sildenafil  (VIAGRA) 100 MG tablet Take 0.5-1 tablets (50-100 mg total) by mouth daily as needed for erectile dysfunction. (Patient not taking: Reported on 01/01/2022) 5 tablet 11   triamcinolone cream (KENALOG) 0.1 % Apply 1 application topically 2 (two) times daily. 60 g 0   Current Facility-Administered Medications  Medication Dose Route Frequency Provider Last Rate Last Admin   0.9 %  sodium chloride infusion  500 mL Intravenous Once Sashia Campas V, MD       0.9 %  sodium chloride infusion  500 mL Intravenous Once Mauri Pole, MD        Allergies as of 01/15/2022   (No Known Allergies)    Family History  Problem Relation Age of Onset   Diabetes Mother    Hyperlipidemia Mother    Hypertension Mother    Diabetes Paternal Uncle    Crohn's disease Cousin        2nd cousin   Pancreatic cancer Cousin    Colon cancer Neg Hx    Esophageal cancer Neg Hx    Colon polyps Neg Hx    Rectal cancer Neg Hx    Stomach cancer Neg Hx     Social History   Socioeconomic History   Marital status: Single    Spouse name: [name]   Number of children: Not on file   Years of education: Not on file   Highest education level: Not  on file  Occupational History   Occupation: Careers adviser: [employer]  Tobacco Use   Smoking status: Light Smoker    Types: Cigars   Smokeless tobacco: Never   Tobacco comments:    One cigar per month or less  Vaping Use   Vaping Use: Never used  Substance and Sexual Activity   Alcohol use: Yes    Alcohol/week: 10.0 standard drinks of alcohol    Types: 10 Cans of beer per week   Drug use: Never   Sexual activity: Yes    Partners: Female  Other Topics Concern   Not on file  Social History Narrative   1-2 sodas per day. Some regular exercise. Does smoke a cigar from time to time. 10 beers per week. He currently lives with [name] currently works at [employer].     Social Determinants of Health   Financial  Resource Strain: Not on file  Food Insecurity: Not on file  Transportation Needs: Not on file  Physical Activity: Not on file  Stress: Not on file  Social Connections: Not on file  Intimate Partner Violence: Not on file    Review of Systems:  All other review of systems negative except as mentioned in the HPI.  Physical Exam: Vital signs in last 24 hours: Blood Pressure 136/83   Pulse 64   Temperature 98.5 F (36.9 C) (Temporal)   Respiration 13   Height '5\' 10"'$  (1.778 m)   Weight 215 lb (97.5 kg)   Oxygen Saturation 97%   Body Mass Index 30.85 kg/m  General:   Alert, NAD Lungs:  Clear .   Heart:  Regular rate and rhythm Abdomen:  Soft, nontender and nondistended. Neuro/Psych:  Alert and cooperative. Normal mood and affect. A and O x 3  Reviewed labs, radiology imaging, old records and pertinent past GI work up  Patient is appropriate for planned procedure(s) and anesthesia in an ambulatory setting   K. Denzil Magnuson , MD [phone]

## 2022-01-15 NOTE — Op Note (Signed)
[facility] Patient Name: Andre Henderson Procedure Date: 01/15/2022 2:48 PM MRN: [id number] Endoscopist: Mauri Pole , MD Age: 51 Referring MD:  Date of Birth: 09/06/1970 Gender: Male Account #: [id number] Procedure:                Colonoscopy Indications:              High risk colon cancer surveillance: Personal                            history of colonic polyps, High risk colon cancer                            surveillance: Personal history of sessile serrated                            colon polyp (less than 10 mm in size) with no                            dysplasia, High risk colon cancer surveillance:                            Ulcerative pancolitis of 8 (or more) years duration Medicines:                Monitored Anesthesia Care Procedure:                Pre-Anesthesia Assessment:                           - Prior to the procedure, a History and Physical                            was performed, and patient medications and                            allergies were reviewed. The patient's tolerance of                            previous anesthesia was also reviewed. The risks                            and benefits of the procedure and the sedation                            options and risks were discussed with the patient.                            All questions were answered, and informed consent                            was obtained. Prior Anticoagulants: The patient has                            taken no previous anticoagulant or antiplatelet  agents. ASA Grade Assessment: II - A patient with                            mild systemic disease. After reviewing the risks                            and benefits, the patient was deemed in                            satisfactory condition to undergo the procedure.                           After obtaining informed consent, the colonoscope                            was passed under  direct vision. Throughout the                            procedure, the patient's blood pressure, pulse, and                            oxygen saturations were monitored continuously. The                            Olympus PCF-H190DL ([id number]#[id number]) Colonoscope was                            introduced through the anus and advanced to the the                            terminal ileum, with identification of the                            appendiceal orifice and IC valve. The colonoscopy                            was performed without difficulty. The patient                            tolerated the procedure well. The quality of the                            bowel preparation was good. The ileocecal valve,                            appendiceal orifice, and rectum were photographed. Scope In: 3:07:18 PM Scope Out: 3:19:22 PM Scope Withdrawal Time: 0 hours 9 minutes 8 seconds  Total Procedure Duration: 0 hours 12 minutes 4 seconds  Findings:                 The perianal and digital rectal examinations were                            normal.  Inflammation was found as medium patches surrounded                            by normal mucosa in the rectum, in the                            recto-sigmoid colon, in the sigmoid colon, in the                            descending colon, in the transverse colon, in the                            hepatic flexure, in the ascending colon and at the                            cecum. This was graded as Mayo Score 2 (moderate,                            with marked erythema, absent vascular pattern,                            friability, erosions), and when compared to the                            previous examination, the findings are worsened.                            Biopsies were taken with a cold forceps for                            histology.                           The terminal ileum appeared normal. Biopsies were                             taken with a cold forceps for histology.                           Non-bleeding external and internal hemorrhoids were                            found during retroflexion. The hemorrhoids were                            medium-sized. Complications:            No immediate complications. Estimated Blood Loss:     Estimated blood loss was minimal. Impression:               - Moderately active (Mayo Score 2) pancolitis                            ulcerative colitis, worsened since the last  examination. Biopsied.                           - The examined portion of the ileum was normal.                            Biopsied.                           - Non-bleeding external and internal hemorrhoids. Recommendation:           - Patient has a contact number available for                            emergencies. The signs and symptoms of potential                            delayed complications were discussed with the                            patient. Return to normal activities tomorrow.                            Written discharge instructions were provided to the                            patient.                           - Resume previous diet.                           - Continue present medications.                           - Await pathology results.                           - Repeat colonoscopy in 1 year for surveillance                            based on pathology results.                           - Return to GI clinic at the next available                            appointment to discuss management of inflammatory                            bowel disease. Mauri Pole, MD 01/15/2022 3:27:03 PM This report has been signed electronically.

## 2022-01-16 ENCOUNTER — Telehealth: Payer: Self-pay | Admitting: *Deleted

## 2022-01-16 ENCOUNTER — Encounter: Payer: Self-pay | Admitting: Gastroenterology

## 2022-01-16 NOTE — Telephone Encounter (Signed)
Attempted f/u phone call. No answer. Left message. °

## 2022-02-01 ENCOUNTER — Other Ambulatory Visit: Payer: Self-pay | Admitting: Family Medicine

## 2022-02-01 DIAGNOSIS — N529 Male erectile dysfunction, unspecified: Secondary | ICD-10-CM

## 2022-02-02 ENCOUNTER — Encounter: Payer: Self-pay | Admitting: Gastroenterology

## 2022-02-18 NOTE — Progress Notes (Unsigned)
02/19/2022 Andre Henderson [id number] 05-16-1970  Referring provider: Hali Henderson, * Primary GI doctor: Dr. Silverio Henderson  ASSESSMENT AND PLAN:   Ulcerative pancolitis without rectal bleeding [facility]) May score 2 on colonoscopy 12/2021, symptoms are rather benign for the patient for the colitis, has never had imaging - will get HRCRP, fecal cal and ESR for objective inflammation information to follow - Will get TMPT and Heb B surface antigen and TB gold in case there is a need to escalate therapy Will send in 3 month supply of Lialda and schedule follow up for repeat inflammatory markers - will check Vitamin D def -Patient is instructed to avoid anti-inflammatories -Patient reminded to remind up-to-date on influenza, pneumococcal and COVID-19 vaccinations, shingles.  Gastroesophageal reflux disease without esophagitis Lifestyle changes discussed, avoid NSAIDS, ETOH Continue on the same medication, reports symptoms are well controlled.   Fatty liver with ETOH use --Continue to work on risk factor modification including diet exercise and control of risk factors including blood sugars. - monitor q 6 months.  - consider elastography in the future - Stop ETOH  History of Present Illness:  51 y.o. male  with a past medical history of gout, cervical radiculopathy, history of tobacco use, GERD, fatty liver, ulcerative pancolitis and others listed below, returns to clinic today for evaluation of recent colonoscopy demonstrating mild active pan colitis.  12/2018 seen by PA Andre Henderson is a new patient for question of possible Crohn's colitis from previous colonoscopy 2002, was having rectal bleeding at the time. 01/19/2019 colonoscopy showed normal TI, 3 polyps 49 mm in the rectum, 1 diminutive polyp, altered vascular, pseudo polypoid scarred mucosa in transverse colon, ascending and cecum. Started on Lialda 2.4 g daily, was not on any medications.  01/15/2022 colonoscopy Dr. Silverio Henderson  due to history of adenomatous polyps and UC, showed moderately active pancolitis ( May score 2)  worsened since last examination, terminal ileum normal.  Nonbleeding internal and external hemorrhoids.  Repeat colonoscopy 1 year. Benign small bowel mucosa TI, mild chronic active colitis negative for dysplasia, mild chronic active colitis negative for dysplasia and left and right colon.  IBD history  Has never been on any medications.   Last small bowel imaging:  never Extraintestinal manifestations: The patient has not had any extraintestinal symptoms. Denies rashes, joint pain, changes in vision.  Surgical history: no surgery.  Other significant medical history:  Current History He has BM twice a day but depends on what he eats.  Will have 1 in the morning and afternoon and occ one at night.  B[place] stool 4-6, mainly 4, rare straining or feeling he will need have large BM but Will have normal BM. Can have some urgency.  Worse with salads.  No blood or mucus in the stool.  No AB pain.  No fever, chills, weight loss, states he is gaining weight.  He does not smoke cigs, Will drink 12 pack to 24 pack of light beer.  Had elevated LFTs, [place] 2019 showed fatty liver.   Recent labs: 10/18/2020 WBC 7.2 HGB 16.0 MCV 94.5 Platelets 269 10/18/2020 AST 22 ALT 42  TBili 0.4  Immunization History  Administered Date(s) Administered   Tdap 11/19/2013    He  reports that he has been smoking cigars. He has never used smokeless tobacco. He reports current alcohol use of about 10.0 standard drinks of alcohol per week. He reports that he does not use drugs. His family history includes Crohn's disease in his cousin; Diabetes in his  mother and paternal uncle; Hyperlipidemia in his mother; Hypertension in his mother; Pancreatic cancer in his cousin.   Current Medications:     Current Outpatient Medications (Cardiovascular):    sildenafil (VIAGRA) 100 MG tablet, Take 0.5-1 tablets (50-100 mg total) by  mouth daily as needed for erectile dysfunction.     Current Outpatient Medications (Analgesics):    allopurinol (ZYLOPRIM) 300 MG tablet, TAKE ONE TABLET BY MOUTH ONE TIME DAILY   diclofenac (VOLTAREN) 50 MG EC tablet, Take 1 tablet (50 mg total) by mouth 2 (two) times daily. (Patient not taking: Reported on 02/19/2022)     Current Outpatient Medications (Other):    mesalamine (LIALDA) 1.2 g EC tablet, Take 2 tablets (2.4 g total) by mouth daily with breakfast.   omeprazole (PRILOSEC) 10 MG capsule, Take 10 mg by mouth once a week.   triamcinolone cream (KENALOG) 0.1 %, Apply 1 application topically 2 (two) times daily.  Current Facility-Administered Medications (Other):    0.9 %  sodium chloride infusion  Surgical History:  He  has a past surgical history that includes Ankle fracture surgery (Right, 2005); Colonoscopy (2020); and Colonoscopy (2002).  Current Medications, Allergies, Past Medical History, Past Surgical History, Family History and Social History were reviewed in [employer] record.  Physical Exam: BP 98/70   Pulse 69   Ht _0  (1.803 m)   Wt 220 lb (99.8 kg)   BMI 30.68 kg/m  General:   Pleasant, well developed male in no acute distress Heart : Regular rate and rhythm; no murmurs Pulm: Clear anteriorly; no wheezing Abdomen:  Soft, Obese AB, Active bowel sounds. No tenderness . Without guarding and Without rebound, No organomegaly appreciated. Rectal: Not evaluated Extremities:  without  edema. Neurologic:  Alert and  oriented x4;  No focal deficits.  Psych:  Cooperative. Normal mood and affect.   Andre Crofts, PA-C 02/19/22

## 2022-02-19 ENCOUNTER — Other Ambulatory Visit (INDEPENDENT_AMBULATORY_CARE_PROVIDER_SITE_OTHER): Payer: 59

## 2022-02-19 ENCOUNTER — Ambulatory Visit (INDEPENDENT_AMBULATORY_CARE_PROVIDER_SITE_OTHER): Payer: 59 | Admitting: Physician Assistant

## 2022-02-19 ENCOUNTER — Encounter: Payer: Self-pay | Admitting: Physician Assistant

## 2022-02-19 VITALS — BP 98/70 | HR 69 | Ht 71.0 in | Wt 220.0 lb

## 2022-02-19 DIAGNOSIS — K760 Fatty (change of) liver, not elsewhere classified: Secondary | ICD-10-CM

## 2022-02-19 DIAGNOSIS — Z79899 Other long term (current) drug therapy: Secondary | ICD-10-CM

## 2022-02-19 DIAGNOSIS — K219 Gastro-esophageal reflux disease without esophagitis: Secondary | ICD-10-CM | POA: Diagnosis not present

## 2022-02-19 DIAGNOSIS — E559 Vitamin D deficiency, unspecified: Secondary | ICD-10-CM

## 2022-02-19 DIAGNOSIS — K51011 Ulcerative (chronic) pancolitis with rectal bleeding: Secondary | ICD-10-CM

## 2022-02-19 DIAGNOSIS — M1A0710 Idiopathic chronic gout, right ankle and foot, without tophus (tophi): Secondary | ICD-10-CM | POA: Diagnosis not present

## 2022-02-19 LAB — CBC WITH DIFFERENTIAL/PLATELET
Basophils Absolute: 0.1 10*3/uL (ref 0.0–0.1)
Basophils Relative: 0.9 % (ref 0.0–3.0)
Eosinophils Absolute: 0.1 10*3/uL (ref 0.0–0.7)
Eosinophils Relative: 2.3 % (ref 0.0–5.0)
HCT: 46.9 % (ref 39.0–52.0)
Hemoglobin: 15.5 g/dL (ref 13.0–17.0)
Lymphocytes Relative: 39.4 % (ref 12.0–46.0)
Lymphs Abs: 2.5 10*3/uL (ref 0.7–4.0)
MCHC: 33 g/dL (ref 30.0–36.0)
MCV: 96.3 fl (ref 78.0–100.0)
Monocytes Absolute: 0.7 10*3/uL (ref 0.1–1.0)
Monocytes Relative: 10.8 % (ref 3.0–12.0)
Neutro Abs: 3 10*3/uL (ref 1.4–7.7)
Neutrophils Relative %: 46.6 % (ref 43.0–77.0)
Platelets: 261 10*3/uL (ref 150.0–400.0)
RBC: 4.87 Mil/uL (ref 4.22–5.81)
RDW: 13.7 % (ref 11.5–15.5)
WBC: 6.4 10*3/uL (ref 4.0–10.5)

## 2022-02-19 LAB — HEPATIC FUNCTION PANEL
ALT: 58 U/L — ABNORMAL HIGH (ref 0–53)
AST: 29 U/L (ref 0–37)
Albumin: 4.3 g/dL (ref 3.5–5.2)
Alkaline Phosphatase: 50 U/L (ref 39–117)
Bilirubin, Direct: 0.1 mg/dL (ref 0.0–0.3)
Total Bilirubin: 0.3 mg/dL (ref 0.2–1.2)
Total Protein: 6.9 g/dL (ref 6.0–8.3)

## 2022-02-19 LAB — HIGH SENSITIVITY CRP: CRP, High Sensitivity: 2.71 mg/L (ref 0.000–5.000)

## 2022-02-19 LAB — BASIC METABOLIC PANEL
BUN: 14 mg/dL (ref 6–23)
CO2: 30 mEq/L (ref 19–32)
Calcium: 9.4 mg/dL (ref 8.4–10.5)
Chloride: 102 mEq/L (ref 96–112)
Creatinine, Ser: 0.97 mg/dL (ref 0.40–1.50)
GFR: 89.03 mL/min (ref >60.00)
Glucose, Bld: 157 mg/dL — ABNORMAL HIGH (ref 70–99)
Potassium: 4.5 mEq/L (ref 3.5–5.1)
Sodium: 138 mEq/L (ref 135–145)

## 2022-02-19 LAB — SEDIMENTATION RATE: Sed Rate: 9 mm/hr (ref 0–20)

## 2022-02-19 MED ORDER — MESALAMINE 1.2 G PO TBEC: 2.4000 g | DELAYED_RELEASE_TABLET | Freq: Every day | ORAL | 2 refills | Status: DC

## 2022-02-19 MED ORDER — MESALAMINE 1.2 G PO TBEC: 2.4000 g | DELAYED_RELEASE_TABLET | Freq: Every day | ORAL | 0 refills | Status: DC

## 2022-02-19 NOTE — Patient Instructions (Addendum)
Your provider has requested that you go to the basement level for lab work before leaving today. Press "B" on the elevator. The lab is located at the first door on the left as you exit the elevator.  We have sent the following medications to your pharmacy for you to pick up at your convenience: Mesalamine   Please follow up in 3 months. Give [place] a call at [phone] to schedule an appointment.   Health Maintenance in Inflammatory Bowel Disease  Vaccines Inflammatory Bowel Disease (IBD) is often treated with immunomodulatory medications (6-mercaptopurine, azathioprine, methotrexate), biologic therapies (adalimumab, certulizomab, natalizumab, infliximab, vedolizumab) or chronic steroids (at least '10mg'$  daily for 3 months), which suppress the immune system.  Patients receiving this type of therapy are at higher risk to develop infections.  Many infections can be prevented by vaccinations. Inflammatory bowel disease within itself can cause inflammation and weaken the immune system.  Discuss vaccinations with your physician, but in general, IBD patients SHOULD receive the following vaccines:  Shingles vaccination Patient is at increased risk for immune compromised such as those with inflammatory bowel disease are able to get the Shingrix vaccine age 64 and up. 2 part shot, second shot between second and 28-month  If you go over 6 months for the second shot have to restart series. Shingles is herpes virus dormant along the nerve root, can cause deafness, blindness and lasting pain.  Hepatitis A and B -We can test for this and able to get this in our office.   Human papilloma virus (HPV)  The HPV vaccine is recommended in females aged 929-45 It is a 2-dose vaccine.   The HPV vaccine is also now recommended in males aged 13-45  Influenza  All adults should get the influenza vaccine annually.  IBD patients on immunosuppressive therapy should NOT get the intranasal vaccine, as it is a live  vaccine.  Pneumococcal  All adults 65 years or older should receive the Pneumococcal Conjugate Vaccine (PCV13), followed by the Pneumococcal Polysaccharide Vaccine (PPSV23) at least 1 year later.  In addition, patients over the age of 126on immunosuppressive therapy should receive the PCV13, followed by PPSV23 no sooner than 8 weeks later.  A booster PPSV23 is also recommended 5 years later.  Finally, patients that smoke should get the PPSV23 if not otherwise vaccinated.  Tetanus, diphtheria, pertussis (Tdap/Td)  All adults should receive a 1-time Tdap.  A Td booster should be administered every 10 years.   Bone Health Patients with IBD can have markedly lower bone mineral densities than patients without IBD. Low bone mineral density is associated with fractures. Therefore, screening for bone disease, such as osteoporosis and osteopenia, is important in certain populations.  The American Gastroenterology Association (AGA) and the [facility] ([facility] recommend dual-energy x-ray absorptiometry scanning (DEXA) in postmenopausal women, men over the age of 560 patients with prolonged corticosteroid use (greater than 3 consecutive months or recurrent courses), patients with a personal history of low trauma fracture and patients with hypogonadism.   Tobacco Cessation Smoking worsens Crohn's disease. In addition, smoking is associated with many known cardiac, pulmonary and oncologic risks.   If you need help with quitting smoking, please talk to your doctor and call 1-800-QUIT NOW.  Cancer Screening IBD patients on immunosuppressive therapy may be more susceptible to certain types of cancer. However, specific evidence is conflicting. Therefore, patients with IBD should undergo age- and sex-specific cancer screening. Please discuss this important issue with your primary care provider.  Colon  Cancer  Patients with either Ulcerative Colitis (UC) or Crohn's disease involving the  colon should have colon cancer screening 8-10 years after initial diagnosis. After that time, the frequency of surveillance colonocoscopy will be determined by your gastroenterologist, but will usually occur every 1-2 years.   Patients with IBD and Primary Sclerosing Cholangitis ([place]) need a screening colonoscopy at the time of diagnosis of [place] and annually thereafter.   References: Itzkowitz SH, Present DH. Consensus Conference: Colorectal Cancer Screening and Surveillance in Inflammatory Bowel Disease. Inflamm Bowel Dis 2005;11:314-321.   Moscandrew M, Mahadevan U, Kane S. General Health Maintenance in IBD. Inflamm Bowel Dis 2009;15 [id number].  Metabolic dysfunction associated seatohepatitis  Now the leading cause of liver failure in the [place].  It is normally from such risk factors as obesity, diabetes, insulin resistance, high cholesterol, or metabolic syndrome.  The only definitive therapy is weight loss and exercise.   Suggest walking 20-30 mins daily.  Decreasing carbohydrates, increasing veggies.    Fatty Liver Fatty liver is the accumulation of fat in liver cells. It is also called hepatosteatosis or steatohepatitis. It is normal for your liver to contain some fat. If fat is more than 5 to 10% of your liver's weight, you have fatty liver.  There are often no symptoms (problems) for years while damage is still occurring. People often learn about their fatty liver when they have medical tests for other reasons. Fat can damage your liver for years or even decades without causing problems. When it becomes severe, it can cause fatigue, weight loss, weakness, and confusion. This makes you more likely to develop more serious liver problems. The liver is the largest organ in the body. It does a lot of work and often gives no warning signs when it is sick until late in a disease. The liver has many important jobs including: Breaking down foods. Storing vitamins, iron, and other  minerals. Making proteins. Making bile for food digestion. Breaking down many products including medications, alcohol and some poisons.  PROGNOSIS  Fatty liver may cause no damage or it can lead to an inflammation of the liver. This is, called steatohepatitis.  Over time the liver may become scarred and hardened. This condition is called cirrhosis. Cirrhosis is serious and may lead to liver failure or cancer. NASH is one of the leading causes of cirrhosis. About 10-20% of Americans have fatty liver and a smaller 2-5% has NASH.  TREATMENT  Weight loss, fat restriction, and exercise in overweight patients produces inconsistent results but is worth trying. Good control of diabetes may reduce fatty liver. Eat a balanced, healthy diet. Increase your physical activity. There are no medical or surgical treatments for a fatty liver or NASH, but improving your diet and increasing your exercise may help prevent or reverse some of the damage.

## 2022-02-20 LAB — HEPATIC FUNCTION PANEL
ALT: 58 U/L — AB (ref 10–40)
AST: 29 (ref 14–40)
Alkaline Phosphatase: 50 (ref 25–125)
Bilirubin, Direct: 0.1
Bilirubin, Total: 0.3

## 2022-02-20 LAB — CBC AND DIFFERENTIAL
HCT: 47 (ref 41–53)
Hemoglobin: 15.5 (ref 13.5–17.5)
Platelets: 261 10*3/uL (ref 150–400)
WBC: 6.4

## 2022-02-20 LAB — COMPREHENSIVE METABOLIC PANEL
Calcium: 9.4 (ref 8.7–10.7)
eGFR: 89.03

## 2022-02-20 LAB — BASIC METABOLIC PANEL
BUN: 14 (ref 4–21)
CO2: 30 — AB (ref 13–22)
Chloride: 102 (ref 99–108)
Creatinine: 1 (ref 0.6–1.3)
Glucose: 157
Potassium: 4.5 mEq/L (ref 3.5–5.1)
Sodium: 138 (ref 137–147)

## 2022-02-20 LAB — CBC: RBC: 4.87 (ref 3.87–5.11)

## 2022-02-26 LAB — QUANTIFERON-TB GOLD PLUS
Mitogen-NIL: >10 IU/mL
NIL: 0.05 IU/mL
QuantiFERON-TB Gold Plus: NEGATIVE
TB1-NIL: 0.02 IU/mL
TB2-NIL: 0 IU/mL

## 2022-02-27 LAB — VITAMIN D 1,25 DIHYDROXY
Vitamin D 1, 25 (OH)2 Total: 29 pg/mL (ref 18–72)
Vitamin D2 1, 25 (OH)2: <8 pg/mL
Vitamin D3 1, 25 (OH)2: 29 pg/mL

## 2022-02-27 LAB — HEPATITIS B SURFACE ANTIGEN: Hepatitis B Surface Ag: NONREACTIVE

## 2022-02-27 LAB — THIOPURINE METHYLTRANSFERASE (TPMT), RBC: Thiopurine Methyltransferase, RBC: 15 nmol/hr/mL RBC

## 2022-03-05 ENCOUNTER — Encounter: Payer: Self-pay | Admitting: Family Medicine

## 2022-03-05 DIAGNOSIS — N529 Male erectile dysfunction, unspecified: Secondary | ICD-10-CM

## 2022-03-05 MED ORDER — SILDENAFIL CITRATE 100 MG PO TABS: 50.0000 mg | ORAL_TABLET | Freq: Every day | ORAL | 1 refills | Status: DC | PRN

## 2022-03-05 MED ORDER — ALLOPURINOL 300 MG PO TABS: 300.0000 mg | ORAL_TABLET | Freq: Every day | ORAL | 0 refills | Status: DC

## 2022-03-05 NOTE — Telephone Encounter (Signed)
Meds ordered this encounter  Medications   allopurinol (ZYLOPRIM) 300 MG tablet    Sig: Take 1 tablet (300 mg total) by mouth daily.    Dispense:  45 tablet    Refill:  0   sildenafil (VIAGRA) 100 MG tablet    Sig: Take 0.5-1 tablets (50-100 mg total) by mouth daily as needed for erectile dysfunction.    Dispense:  5 tablet    Refill:  1   Gave enough meds until appointment in January.  Please keep appointment in January.

## 2022-03-05 NOTE — Telephone Encounter (Signed)
Patient hasn't been seen since July 2022, I assume needs to see you before refills dispensed?

## 2022-04-16 ENCOUNTER — Ambulatory Visit (INDEPENDENT_AMBULATORY_CARE_PROVIDER_SITE_OTHER): Payer: 59 | Admitting: Family Medicine

## 2022-04-16 ENCOUNTER — Encounter: Payer: Self-pay | Admitting: Family Medicine

## 2022-04-16 VITALS — BP 123/77 | HR 70 | Ht 70.0 in | Wt 220.0 lb

## 2022-04-16 DIAGNOSIS — Z1159 Encounter for screening for other viral diseases: Secondary | ICD-10-CM

## 2022-04-16 DIAGNOSIS — Z0000 Encounter for general adult medical examination without abnormal findings: Secondary | ICD-10-CM

## 2022-04-16 DIAGNOSIS — R5383 Other fatigue: Secondary | ICD-10-CM

## 2022-04-16 DIAGNOSIS — Z23 Encounter for immunization: Secondary | ICD-10-CM | POA: Diagnosis not present

## 2022-04-16 DIAGNOSIS — R7309 Other abnormal glucose: Secondary | ICD-10-CM | POA: Diagnosis not present

## 2022-04-16 NOTE — Progress Notes (Signed)
Complete physical exam  Patient: Andre Henderson   DOB: 20-Feb-1971   52 y.o. Male  MRN: [id number]  Subjective:    Chief Complaint  Patient presents with   Annual Exam    Andre Henderson is a 52 y.o. male who presents today for a complete physical exam. He reports consuming a general diet. The patient does not participate in regular exercise at present. He generally feels poorly. He does not have additional problems to discuss today.  He admits he has not really been taking care of himself.  He has been stress eating.  Work has been really stressful as his company was taken over.  He is also been drinking more alcohol and not going to the gym.  He did have some blood work done back in the fall.  He did bring a copy for [place] to review.  The only significant abnormality is up mild elevation in ALT at 58.    Has been more fatigued.    Most recent fall risk assessment:    04/16/2022    9:05 AM  Fall Risk   Falls in the past year? 0  Number falls in past yr: 0  Injury with Fall? 0  Risk for fall due to : No Fall Risks  Follow up Falls evaluation completed     Most recent depression screenings:    04/16/2022    9:06 AM 10/10/2020    1:13 PM  PHQ 2/9 Scores  PHQ - 2 Score 0 0        Patient Care Team: Hali Marry, MD as PCP - General (Family Medicine)   Outpatient Medications Prior to Visit  Medication Sig   allopurinol (ZYLOPRIM) 300 MG tablet Take 1 tablet (300 mg total) by mouth daily.   cholecalciferol (VITAMIN D3) 25 MCG (1000 UNIT) tablet Take 1,000 Units by mouth daily.   mesalamine (LIALDA) 1.2 g EC tablet Take 2 tablets (2.4 g total) by mouth daily with breakfast.   omeprazole (PRILOSEC) 10 MG capsule Take 10 mg by mouth once a week.   [DISCONTINUED] sildenafil (VIAGRA) 100 MG tablet Take 0.5-1 tablets (50-100 mg total) by mouth daily as needed for erectile dysfunction.   [DISCONTINUED] 0.9 %  sodium chloride infusion    No facility-administered medications prior  to visit.    ROS        Objective:     BP 123/77   Pulse 70   Ht '5\' 10"'$  (1.778 m)   Wt 220 lb (99.8 kg)   SpO2 95%   BMI 31.57 kg/m    Physical Exam Constitutional:      Appearance: He is well-developed.  HENT:     Head: Normocephalic and atraumatic.     Right Ear: Tympanic membrane, ear canal and external ear normal.     Left Ear: Tympanic membrane, ear canal and external ear normal.     Nose: Nose normal.     Mouth/Throat:     Pharynx: Oropharynx is clear.  Eyes:     Conjunctiva/sclera: Conjunctivae normal.     Pupils: Pupils are equal, round, and reactive to light.  Neck:     Thyroid: No thyromegaly.  Cardiovascular:     Rate and Rhythm: Normal rate and regular rhythm.     Heart sounds: Normal heart sounds.  Pulmonary:     Effort: Pulmonary effort is normal.     Breath sounds: Normal breath sounds.  Abdominal:     General: Bowel sounds are normal. There is  no distension.     Palpations: Abdomen is soft. There is no mass.     Tenderness: There is no abdominal tenderness. There is no guarding or rebound.  Musculoskeletal:        General: Normal range of motion.     Cervical back: Normal range of motion and neck supple. No tenderness.  Lymphadenopathy:     Cervical: No cervical adenopathy.  Skin:    General: Skin is warm and dry.     Comments: He has a couple seborrheic keratosis and a few skin tags.  He might return to have those treated.  Neurological:     Mental Status: He is alert and oriented to person, place, and time.     Deep Tendon Reflexes: Reflexes are normal and symmetric.  Psychiatric:        Behavior: Behavior normal.        Thought Content: Thought content normal.        Judgment: Judgment normal.      No results found for any visits on 04/16/22.     Assessment & Plan:    Routine Health Maintenance and Physical Exam  Immunization History  Administered Date(s) Administered   Tdap 11/19/2013   Zoster Recombinat (Shingrix)  04/16/2022    Health Maintenance  Topic Date Due   Hepatitis C Screening  Never done   COVID-19 Vaccine (1) 10/27/2022 (Originally 06/19/1975)   INFLUENZA VACCINE  06/30/2023 (Originally 10/30/2021)   Zoster Vaccines- Shingrix (2 of 2) 06/11/2022   COLONOSCOPY (Pts 45-82yr Insurance coverage will need to be confirmed)  01/16/2023   DTaP/Tdap/Td (2 - Td or Tdap) 11/20/2023   HIV Screening  Completed   HPV VACCINES  Aged Out   Pneumococcal Vaccine 135635Years old  Discontinued    Discussed health benefits of physical activity, and encouraged him to engage in regular exercise appropriate for his age and condition.  Problem List Items Addressed This Visit   None Visit Diagnoses     Wellness examination    -  Primary   Relevant Orders   PSA   Lipid panel   COMPLETE METABOLIC PANEL WITH GFR   CBC   Testosterone Total,Free,Bio, Males   Hemoglobin A1c   Hepatitis C Antibody   TSH   Abnormal glucose       Relevant Orders   Hemoglobin A1c   Need for hepatitis C screening test       Relevant Orders   Hepatitis C Antibody   Other fatigue       Relevant Orders   PSA   Lipid panel   COMPLETE METABOLIC PANEL WITH GFR   CBC   Testosterone Total,Free,Bio, Males   Hemoglobin A1c   Hepatitis C Antibody   TSH   Need for Zostavax administration       Relevant Orders   Zoster Recombinant (Shingrix ) (Completed)       Keep up a regular exercise program and make sure you are eating a healthy diet Try to eat 4 servings of dairy a day, or if you are lactose intolerant take a calcium with vitamin D daily.  First shingles vaccine given today.  Declined flu shot.   Really encouraged him to get back on track with his diet exercise and cutting back on alcohol.  He is already started to make some of those changes in the last 2 weeks.  Return in about 1 year (around 04/17/2023) for Wellness Exam.     CBeatrice Lecher MD

## 2022-04-17 ENCOUNTER — Encounter: Payer: Self-pay | Admitting: Family Medicine

## 2022-04-17 LAB — LIPID PANEL
Cholesterol: 236 mg/dL — ABNORMAL HIGH (ref <200)
HDL: 50 mg/dL (ref >=40)
LDL Cholesterol (Calc): 157 mg/dL (calc) — ABNORMAL HIGH
Non-HDL Cholesterol (Calc): 186 mg/dL (calc) — ABNORMAL HIGH (ref <130)
Total CHOL/HDL Ratio: 4.7 (calc) (ref <5.0)
Triglycerides: 155 mg/dL — ABNORMAL HIGH (ref <150)

## 2022-04-17 LAB — CBC
HCT: 45.6 % (ref 38.5–50.0)
Hemoglobin: 15.6 g/dL (ref 13.2–17.1)
MCH: 31.3 pg (ref 27.0–33.0)
MCHC: 34.2 g/dL (ref 32.0–36.0)
MCV: 91.6 fL (ref 80.0–100.0)
MPV: 10.7 fL (ref 7.5–12.5)
Platelets: 287 10*3/uL (ref 140–400)
RBC: 4.98 10*6/uL (ref 4.20–5.80)
RDW: 12.7 % (ref 11.0–15.0)
WBC: 6.4 10*3/uL (ref 3.8–10.8)

## 2022-04-17 LAB — TESTOSTERONE TOTAL,FREE,BIO, MALES
Albumin: 4.8 g/dL (ref 3.6–5.1)
Sex Hormone Binding: 20 nmol/L (ref 10–50)
Testosterone: 190 ng/dL — ABNORMAL LOW (ref 250–827)

## 2022-04-17 LAB — COMPLETE METABOLIC PANEL WITH GFR
AG Ratio: 1.8 (calc) (ref 1.0–2.5)
ALT: 84 U/L — ABNORMAL HIGH (ref 9–46)
AST: 39 U/L — ABNORMAL HIGH (ref 10–35)
Albumin: 4.8 g/dL (ref 3.6–5.1)
Alkaline phosphatase (APISO): 57 U/L (ref 35–144)
BUN: 13 mg/dL (ref 7–25)
CO2: 27 mmol/L (ref 20–32)
Calcium: 10.3 mg/dL (ref 8.6–10.3)
Chloride: 103 mmol/L (ref 98–110)
Creat: 1.1 mg/dL (ref 0.70–1.30)
Globulin: 2.7 g/dL (calc) (ref 1.9–3.7)
Glucose, Bld: 145 mg/dL — ABNORMAL HIGH (ref 65–99)
Potassium: 4.7 mmol/L (ref 3.5–5.3)
Sodium: 140 mmol/L (ref 135–146)
Total Bilirubin: 0.3 mg/dL (ref 0.2–1.2)
Total Protein: 7.5 g/dL (ref 6.1–8.1)
eGFR: 80 mL/min/{1.73_m2} (ref >=60)

## 2022-04-17 LAB — HEMOGLOBIN A1C
Hgb A1c MFr Bld: 8 % of total Hgb — ABNORMAL HIGH (ref <5.7)
Mean Plasma Glucose: 183 mg/dL
eAG (mmol/L): 10.1 mmol/L

## 2022-04-17 LAB — TSH: TSH: 1.41 mIU/L (ref 0.40–4.50)

## 2022-04-17 LAB — HEPATITIS C ANTIBODY: Hepatitis C Ab: NONREACTIVE

## 2022-04-17 LAB — PSA: PSA: 0.97 ng/mL (ref <=4.00)

## 2022-04-17 MED ORDER — METFORMIN HCL ER 500 MG PO TB24: 500.0000 mg | ORAL_TABLET | Freq: Every day | ORAL | 1 refills | Status: DC

## 2022-04-17 NOTE — Addendum Note (Signed)
Addended by: Beatrice Lecher D on: 04/17/2022 01:38 PM   Modules accepted: Orders

## 2022-04-17 NOTE — Progress Notes (Signed)
Hi Andre Henderson, your A1c is up to 8.0.  This puts you into the full-blown diabetes range.  I would like to start you on metformin to help reduce your blood sugars.  In addition really working on healthy diet including cutting back on carbs, starches and sugars and eating more vegetables and lean proteins.  Exercising regularly.  I like to see you back in 3 months to make sure that we are getting your A1c down.  Do a fingerstick at that time.  Your cholesterol has jumped way up as well.  It was 107 a year ago it was 157 this time.  This puts your cardiovascular risk score at 19%.  This means that over the next 10 years your risk for having a heart attack or stroke is almost 1 out of 5.  That is actually extremely high and I would recommend that you consider a cholesterol medication to reduce your risk for heart disease and stroke.  Also your liver enzymes are elevated likely from inflammation.  I would like to recheck that in a month to see if they are coming down as you are improving your diet and getting your blood sugars under better control.  Your testosterone was on the low end of normal.  I would like to recheck that in a month when we recheck your liver function.  Prostate test was normal.  Blood count is also normal no sign of anemia.  Negative for hepatitis C.  Your thyroid looks great.  The 10-year ASCVD risk score (Arnett DK, et al., 2019) is: 19.4%   Values used to calculate the score:     Age: 51 years     Sex: Male     Is Non-Hispanic African American: No     Diabetic: Yes     Tobacco smoker: Yes     Systolic Blood Pressure: 161 mmHg     Is BP treated: No     HDL Cholesterol: 50 mg/dL     Total Cholesterol: 236 mg/dL

## 2022-04-26 ENCOUNTER — Encounter: Payer: Self-pay | Admitting: Gastroenterology

## 2022-06-07 ENCOUNTER — Other Ambulatory Visit: Payer: Self-pay | Admitting: Family Medicine

## 2022-06-17 ENCOUNTER — Ambulatory Visit (INDEPENDENT_AMBULATORY_CARE_PROVIDER_SITE_OTHER): Payer: 59 | Admitting: Physician Assistant

## 2022-06-17 DIAGNOSIS — Z23 Encounter for immunization: Secondary | ICD-10-CM

## 2022-06-17 NOTE — Progress Notes (Signed)
   Established Patient Office Visit  Subjective   Patient ID: Andre Henderson, male    DOB: 09-24-1970  Age: 52 y.o. MRN: [id number]  Chief Complaint  Patient presents with   Immunizations    HPI  Markail Michels is here for last shingles vaccine.   ROS    Objective:     There were no vitals taken for this visit.   Physical Exam   No results found for any visits on 06/17/22.    The 10-year ASCVD risk score (Arnett DK, et al., 2019) is: 19.4%    Assessment & Plan:  Shingles vaccine - Patient tolerated injection well without complications.    Problem List Items Addressed This Visit   None Visit Diagnoses     Need for shingles vaccine    -  Primary   Relevant Orders   Varicella-zoster vaccine IM (Completed)       No follow-ups on file.    Lavell Luster, [place]

## 2022-06-17 NOTE — Progress Notes (Signed)
Agree with below plan

## 2022-08-09 ENCOUNTER — Other Ambulatory Visit: Payer: Self-pay | Admitting: Family Medicine

## 2022-09-19 ENCOUNTER — Encounter: Payer: Self-pay | Admitting: Family Medicine

## 2023-01-28 ENCOUNTER — Other Ambulatory Visit: Payer: Self-pay | Admitting: Family Medicine

## 2023-01-28 DIAGNOSIS — N529 Male erectile dysfunction, unspecified: Secondary | ICD-10-CM

## 2023-04-21 ENCOUNTER — Encounter: Payer: 59 | Admitting: Family Medicine

## 2023-08-19 ENCOUNTER — Ambulatory Visit
Admission: EM | Admit: 2023-08-19 | Discharge: 2023-08-19 | Disposition: A | Discharge status: Home / Self Care | Payer: Self-pay | Attending: Family Medicine | Admitting: Family Medicine

## 2023-08-19 ENCOUNTER — Encounter: Payer: Self-pay | Admitting: Emergency Medicine

## 2023-08-19 DIAGNOSIS — L03114 Cellulitis of left upper limb: Secondary | ICD-10-CM

## 2023-08-19 DIAGNOSIS — L02412 Cutaneous abscess of left axilla: Secondary | ICD-10-CM

## 2023-08-19 MED ORDER — CEFADROXIL 500 MG PO CAPS: 500.0000 mg | ORAL_CAPSULE | Freq: Two times a day (BID) | ORAL | 0 refills | Status: DC

## 2023-08-19 MED ORDER — CEFTRIAXONE SODIUM 1 G IJ SOLR
1000.0000 mg | Freq: Once | INTRAMUSCULAR | Status: AC
  Administered 2023-08-19: 1000 mg via INTRAMUSCULAR

## 2023-08-19 NOTE — ED Provider Notes (Signed)
 Andre Henderson [facility]    CSN: [id number] Arrival date & time: 08/19/23  1408      History   Chief Complaint Chief Complaint  Patient presents with   Abscess    HPI Mandel Seiden is a 53 y.o. male.   HPI  Patient states that over the years she has had difficulty with skin infections.  At times he will have a cluster of skin infections in his groin or in his armpits.  He states that these have been treated with antibiotics and generally the abscess will rupture on its own.  He states he currently has had a cluster of bumps in his right axilla.  These are resolving.  In his left axilla he has a large abscess that just drained yesterday and a new 1 in his bicep area that is quite large and red. He has not been following his blood sugars.  He does not feel like his sugars have been high He has no fever or malaise  Past Medical History:  Diagnosis Date   Arthritis    Colitis 2002   Gout     Patient Active Problem List   Diagnosis Date Noted   Erectile dysfunction 10/10/2020   Low libido 10/10/2020   Cervical radiculopathy at C6 10/27/2018   Gastroesophageal reflux disease without esophagitis 10/20/2017   Fatty liver 05/16/2017   Diabetes type 2, controlled (HCC) 11/19/2013   Gout 10/15/2013   Tobacco abuse 10/15/2013   Idiopathic chronic gout of right foot without tophus 10/15/2013    Past Surgical History:  Procedure Laterality Date   ANKLE FRACTURE SURGERY Right 2005   COLONOSCOPY  2020   COLONOSCOPY  2002       Home Medications    Prior to Admission medications   Medication Sig Start Date End Date Taking? Authorizing Provider  allopurinol  (ZYLOPRIM ) 300 MG tablet TAKE ONE TABLET BY MOUTH ONE TIME DAILY 08/09/22  Yes Cydney Draft, MD  cefadroxil (DURICEF) 500 MG capsule Take 1 capsule (500 mg total) by mouth 2 (two) times daily. 08/19/23  Yes Stephany Ehrich, MD  cholecalciferol (VITAMIN D3) 25 MCG (1000 UNIT) tablet Take 1,000 Units by mouth daily.    Yes [provider]  metFORMIN  (GLUCOPHAGE -XR) 500 MG 24 hr tablet Take 1 tablet (500 mg total) by mouth daily with breakfast. 04/17/22  Yes Cydney Draft, MD  omeprazole (PRILOSEC) 10 MG capsule Take 10 mg by mouth once a week.   Yes [provider]  sildenafil  (VIAGRA ) 100 MG tablet TAKE ONE-HALF TO ONE TABLET BY MOUTH DAILY AS NEEDED FOR ERECTILE DYSFUNCTION 01/28/23  Yes Cydney Draft, MD  mesalamine  (LIALDA ) 1.2 g EC tablet Take 2 tablets (2.4 g total) by mouth daily with breakfast. 02/19/22 05/20/22  Edmonia Gottron, PA-C    Family History Family History  Problem Relation Age of Onset   Diabetes Mother    Hyperlipidemia Mother    Hypertension Mother    Diabetes Paternal Uncle    Crohn's disease Cousin        2nd cousin   Pancreatic cancer Cousin    Colon cancer Neg Hx    Esophageal cancer Neg Hx    Colon polyps Neg Hx    Rectal cancer Neg Hx    Stomach cancer Neg Hx     Social History Social History   Tobacco Use   Smoking status: Light Smoker    Types: Cigars   Smokeless tobacco: Never   Tobacco comments:  One cigar per month or less  Vaping Use   Vaping status: Never Used  Substance Use Topics   Alcohol use: Yes    Alcohol/week: 10.0 standard drinks of alcohol    Types: 10 Cans of beer per week   Drug use: Never     Allergies   Patient has no known allergies.   Review of Systems Review of Systems See HPI  Physical Exam Triage Vital Signs ED Triage Vitals  Encounter Vitals Group     BP 08/19/23 1421 (!) 152/83     Systolic BP Percentile --      Diastolic BP Percentile --      Pulse Rate 08/19/23 1421 68     Resp 08/19/23 1421 18     Temp 08/19/23 1421 98.4 F (36.9 C)     Temp Source 08/19/23 1421 Oral     SpO2 08/19/23 1421 96 %     Weight 08/19/23 1423 210 lb (95.3 kg)     Height 08/19/23 1423 5\' 11"  (1.803 m)     Head Circumference --      Peak Flow --      Pain Score 08/19/23 1423 2     Pain Loc --       Pain Education --      Exclude from Growth Chart --    No data found.  Updated Vital Signs BP (!) 152/83 (BP Location: Right Arm)   Pulse 68   Temp 98.4 F (36.9 C) (Oral)   Resp 18   Ht 5\' 11"  (1.803 m)   Wt 95.3 kg   SpO2 96%   BMI 29.29 kg/m      Physical Exam Vitals reviewed.   - right axilla with healing multiple small abscesses  Left axilla with healing abscess.  On bicep there is cellulitis with induration - no fluctuance UC Treatments / Results  Labs (all labs ordered are listed, but only abnormal results are displayed) Labs Reviewed - No data to display  EKG   Radiology No results found.  Procedures Procedures (including critical care time)  Medications Ordered in UC Medications  cefTRIAXone (ROCEPHIN) injection 1,000 mg (has no administration in time range)    Initial Impression / Assessment and Plan / UC Course  I have reviewed the triage vital signs and the nursing notes.  Pertinent labs & imaging results that were available during my care of the patient were reviewed by me and considered in my medical decision making (see chart for details).      Final Clinical Impressions(s) / UC Diagnoses   Final diagnoses:  Abscess of left axilla  Cellulitis of left upper arm   Discharge Instructions      You may try warm compresses to area See Dr. Greer Leak if not improving by the end of the week  ED Prescriptions     Medication Sig Dispense Auth. Provider   cefadroxil (DURICEF) 500 MG capsule Take 1 capsule (500 mg total) by mouth 2 (two) times daily. 20 capsule Stephany Ehrich, MD      PDMP not reviewed this encounter.   Stephany Ehrich, MD 08/19/23 [id number]

## 2023-08-19 NOTE — Discharge Instructions (Signed)
 You may try warm compresses to area See Dr. Greer Leak if not improving by the end of the week

## 2023-08-19 NOTE — ED Triage Notes (Signed)
 Patient c/o bilateral abscesses under his axillas x 2 months.  The areas are red and can be painful.  One has recently drained but more have appeared.  Patient has applied boil ease on the areas.

## 2023-09-04 ENCOUNTER — Other Ambulatory Visit: Payer: Self-pay | Admitting: Family Medicine

## 2023-09-05 ENCOUNTER — Other Ambulatory Visit: Payer: Self-pay

## 2023-09-05 ENCOUNTER — Ambulatory Visit
Admission: EM | Admit: 2023-09-05 | Discharge: 2023-09-05 | Disposition: A | Discharge status: Home / Self Care | Payer: Self-pay | Attending: Family Medicine | Admitting: Family Medicine

## 2023-09-05 DIAGNOSIS — L03112 Cellulitis of left axilla: Secondary | ICD-10-CM

## 2023-09-05 DIAGNOSIS — L03111 Cellulitis of right axilla: Secondary | ICD-10-CM

## 2023-09-05 MED ORDER — DOXYCYCLINE HYCLATE 100 MG PO CAPS
ORAL_CAPSULE | ORAL | 0 refills | Status: DC
Start: 2023-09-05 — End: 2023-12-15

## 2023-09-05 MED ORDER — CLINDAMYCIN PHOSPHATE 1 % EX SOLN: Freq: Two times a day (BID) | CUTANEOUS | 0 refills | Status: AC

## 2023-09-05 NOTE — ED Provider Notes (Signed)
 [facility]    CSN: [id number] Arrival date & time: 09/05/23  1844      History   Chief Complaint Chief Complaint  Patient presents with   Rash    HPI Andre Henderson is a 53 y.o. male.   Patient was treated 2.5 weeks ago for cellulitis in his axillae.  He states that the areas improved significantly while taking cefadroxil .  He finished the medication one week ago, but yesterday began developing recurrent swelling in his left upper arm adjacent to the axilla.  He states that he has mild discomfort in his right axilla.  He reports that he has had similar recurring episodes in both axillae and groin during the past several years.  The history is provided by the patient.    Past Medical History:  Diagnosis Date   Arthritis    Colitis 2002   Gout     Patient Active Problem List   Diagnosis Date Noted   Erectile dysfunction 10/10/2020   Low libido 10/10/2020   Cervical radiculopathy at C6 10/27/2018   Gastroesophageal reflux disease without esophagitis 10/20/2017   Fatty liver 05/16/2017   Diabetes type 2, controlled (HCC) 11/19/2013   Gout 10/15/2013   Tobacco abuse 10/15/2013   Idiopathic chronic gout of right foot without tophus 10/15/2013    Past Surgical History:  Procedure Laterality Date   ANKLE FRACTURE SURGERY Right 2005   COLONOSCOPY  2020   COLONOSCOPY  2002       Home Medications    Prior to Admission medications   Medication Sig Start Date End Date Taking? Authorizing Provider  clindamycin  (CLEOCIN  T) 1 % external solution Apply topically 2 (two) times daily. 09/05/23  Yes Leon Rajas, MD  doxycycline  (VIBRAMYCIN ) 100 MG capsule Take one cap PO Q12hr with food. 09/05/23  Yes Leon Rajas, MD  allopurinol  (ZYLOPRIM ) 300 MG tablet TAKE ONE TABLET BY MOUTH ONE TIME DAILY 09/04/23   Adela Holter, DO  cefadroxil  (DURICEF) 500 MG capsule Take 1 capsule (500 mg total) by mouth 2 (two) times daily. 08/19/23   Stephany Ehrich, MD   cholecalciferol (VITAMIN D3) 25 MCG (1000 UNIT) tablet Take 1,000 Units by mouth daily.    [provider]  mesalamine  (LIALDA ) 1.2 g EC tablet Take 2 tablets (2.4 g total) by mouth daily with breakfast. 02/19/22 05/20/22  Edmonia Gottron, PA-C  metFORMIN  (GLUCOPHAGE -XR) 500 MG 24 hr tablet Take 1 tablet (500 mg total) by mouth daily with breakfast. 04/17/22   Cydney Draft, MD  omeprazole (PRILOSEC) 10 MG capsule Take 10 mg by mouth once a week.    [provider]  sildenafil  (VIAGRA ) 100 MG tablet TAKE ONE-HALF TO ONE TABLET BY MOUTH DAILY AS NEEDED FOR ERECTILE DYSFUNCTION 01/28/23   Cydney Draft, MD    Family History Family History  Problem Relation Age of Onset   Diabetes Mother    Hyperlipidemia Mother    Hypertension Mother    Diabetes Paternal Uncle    Crohn's disease Cousin        2nd cousin   Pancreatic cancer Cousin    Colon cancer Neg Hx    Esophageal cancer Neg Hx    Colon polyps Neg Hx    Rectal cancer Neg Hx    Stomach cancer Neg Hx     Social History Social History   Tobacco Use   Smoking status: Light Smoker    Types: Cigars   Smokeless tobacco: Never   Tobacco  comments:    One cigar per month or less  Vaping Use   Vaping status: Never Used  Substance Use Topics   Alcohol use: Yes    Alcohol/week: 10.0 standard drinks of alcohol    Types: 10 Cans of beer per week   Drug use: Never     Allergies   Patient has no known allergies.   Review of Systems Review of Systems  Constitutional:  Negative for activity change, appetite change, chills, diaphoresis, fatigue and fever.  Skin:  Positive for color change.  All other systems reviewed and are negative.    Physical Exam Triage Vital Signs ED Triage Vitals  Encounter Vitals Group     BP 09/05/23 1852 (!) 166/93     Systolic BP Percentile --      Diastolic BP Percentile --      Pulse Rate 09/05/23 1852 85     Resp 09/05/23 1852 16     Temp 09/05/23 1852 99.5  F (37.5 C)     Temp src --      SpO2 09/05/23 1852 96 %     Weight --      Height --      Head Circumference --      Peak Flow --      Pain Score 09/05/23 1853 1     Pain Loc --      Pain Education --      Exclude from Growth Chart --    No data found.  Updated Vital Signs BP (!) 166/93   Pulse 85   Temp 99.5 F (37.5 C)   Resp 16   SpO2 96%   Visual Acuity Right Eye Distance:   Left Eye Distance:   Bilateral Distance:    Right Eye Near:   Left Eye Near:    Bilateral Near:     Physical Exam Vitals and nursing note reviewed.  Constitutional:      General: He is not in acute distress. HENT:     Head: Normocephalic.     Mouth/Throat:     Mouth: Mucous membranes are moist.  Eyes:     Pupils: Pupils are equal, round, and reactive to light.  Cardiovascular:     Rate and Rhythm: Normal rate.  Pulmonary:     Effort: Pulmonary effort is normal.  Skin:    General: Skin is warm and dry.     Comments: Both axillae have multiple areas of scarring without active lesions.  Left proximal arm over triceps muscle adjacent to left axilla has an area of erythema about 3cm diameter, not fluctuant.  Neurological:     Mental Status: He is alert.      UC Treatments / Results  Labs (all labs ordered are listed, but only abnormal results are displayed) Labs Reviewed - No data to display  EKG   Radiology No results found.  Procedures Procedures (including critical care time)  Medications Ordered in UC Medications - No data to display  Initial Impression / Assessment and Plan / UC Course  I have reviewed the triage vital signs and the nursing notes.  Pertinent labs & imaging results that were available during my care of the patient were reviewed by me and considered in my medical decision making (see chart for details).    Patient's present condition is suggestive of hidradenitis suppurativa.  Begin doxycycline  100mg  BID for 10 days, and topical  clindamycin . Recommend follow-up with dermatologist for long term management.  Final Clinical Impressions(s) /  UC Diagnoses   Final diagnoses:  Cellulitis of left axilla  Cellulitis of right axilla     Discharge Instructions      Recommend taking a probiotic such as Kefir (or yogurt with live culture) while taking antibiotic.  ED Prescriptions     Medication Sig Dispense Auth. Provider   doxycycline  (VIBRAMYCIN ) 100 MG capsule Take one cap PO Q12hr with food. 20 capsule Leon Rajas, MD   clindamycin  (CLEOCIN  T) 1 % external solution Apply topically 2 (two) times daily. 60 mL Leon Rajas, MD         Leon Rajas, MD 09/06/23 [phone]

## 2023-09-05 NOTE — Discharge Instructions (Signed)
 Recommend taking a probiotic such as Kefir (or yogurt with live culture) while taking antibiotic.

## 2023-09-05 NOTE — ED Triage Notes (Signed)
 Friday finished abx, here for return of cellulitis to bil axilla. Seen on 5/20 for same.

## 2023-09-11 ENCOUNTER — Telehealth: Payer: Self-pay | Admitting: Physician Assistant

## 2023-09-11 DIAGNOSIS — L02419 Cutaneous abscess of limb, unspecified: Secondary | ICD-10-CM

## 2023-09-11 DIAGNOSIS — B999 Unspecified infectious disease: Secondary | ICD-10-CM

## 2023-09-11 NOTE — Progress Notes (Signed)
  Because of persistent/progressive symptoms despite prior round of antibiotics and no response to this second round of antibiotics, I feel your condition warrants further evaluation and I recommend that you be seen in a face-to-face visit tomorrow at your primary care office, or at urgent care or ER facility this evening so that the area can be reassessed to determine if an incision/drainage is needed or at least a wound culture so we can know the best alternative medications to use for this episode. Unfortunately this is now above what we can properly manage via an e-visit.   NOTE: There will be NO CHARGE for this E-Visit   If you are having a true medical emergency, please call 911.     For an urgent face to face visit, [facility] has multiple urgent care centers for your convenience.  Click the link below for the full list of locations and hours, walk-in wait times, appointment scheduling options and driving directions:  Urgent Care - [place], [place], [place], [place], [place], [place]  [place]     Your MyChart E-visit questionnaire answers were reviewed by a board certified advanced clinical practitioner to complete your personal care plan based on your specific symptoms.    Thank you for using e-Visits.

## 2023-11-16 ENCOUNTER — Other Ambulatory Visit: Payer: Self-pay | Admitting: Family Medicine

## 2023-11-16 DIAGNOSIS — N529 Male erectile dysfunction, unspecified: Secondary | ICD-10-CM

## 2023-12-15 ENCOUNTER — Ambulatory Visit (INDEPENDENT_AMBULATORY_CARE_PROVIDER_SITE_OTHER): Payer: Self-pay | Admitting: Family Medicine

## 2023-12-15 ENCOUNTER — Encounter: Payer: Self-pay | Admitting: Family Medicine

## 2023-12-15 ENCOUNTER — Other Ambulatory Visit: Payer: Self-pay | Admitting: Family Medicine

## 2023-12-15 VITALS — BP 138/81 | HR 70 | Temp 98.7°F | Resp 20 | Ht 71.0 in | Wt 216.0 lb

## 2023-12-15 DIAGNOSIS — R7309 Other abnormal glucose: Secondary | ICD-10-CM

## 2023-12-15 DIAGNOSIS — E1165 Type 2 diabetes mellitus with hyperglycemia: Secondary | ICD-10-CM

## 2023-12-15 DIAGNOSIS — E785 Hyperlipidemia, unspecified: Secondary | ICD-10-CM

## 2023-12-15 DIAGNOSIS — Z0000 Encounter for general adult medical examination without abnormal findings: Secondary | ICD-10-CM

## 2023-12-15 DIAGNOSIS — L989 Disorder of the skin and subcutaneous tissue, unspecified: Secondary | ICD-10-CM

## 2023-12-15 DIAGNOSIS — L732 Hidradenitis suppurativa: Secondary | ICD-10-CM | POA: Insufficient documentation

## 2023-12-15 DIAGNOSIS — E1169 Type 2 diabetes mellitus with other specified complication: Secondary | ICD-10-CM | POA: Insufficient documentation

## 2023-12-15 DIAGNOSIS — Z7984 Long term (current) use of oral hypoglycemic drugs: Secondary | ICD-10-CM

## 2023-12-15 MED ORDER — ATORVASTATIN CALCIUM 20 MG PO TABS: 20.0000 mg | ORAL_TABLET | Freq: Every day | ORAL | 3 refills | Status: AC

## 2023-12-15 MED ORDER — METFORMIN HCL ER 500 MG PO TB24: 500.0000 mg | ORAL_TABLET | Freq: Every day | ORAL | 1 refills | Status: AC

## 2023-12-15 NOTE — Progress Notes (Signed)
 Complete physical exam  Patient: Andre Henderson   DOB: 04/01/1971   53 y.o. Male  MRN: [id number]  Subjective:    Chief Complaint  Patient presents with   Annual Exam    Andre Henderson is a 53 y.o. male who presents today for a complete physical exam. He reports consuming a general diet.  He admits he does not always do the best with his diet.  He did end up trying the metformin  for diabetes but says that it made him feel really tired and had some GI upset so he then tried taking it every other day and then basically ran out.  He says he is willing to try it again or look at an alternative. He stays active, plays golf. He generally feels well.  He does have additional problems to discuss today.   He also wanted to let me know that since he last saw me he was diagnosed with hidradenitis suppurativa he actually initially went to urgent care and then eventually saw dermatology they have him on a topical treatment now and that seems to be helping.  He also has a dry crusting lesion on his neck just above the collarbone that he would like me to look at today says the sure rubs it and he gets really irritated.  He thought maybe it was an ingrown hair initially.  Most recent fall risk assessment:    04/16/2022    9:05 AM  Fall Risk   Falls in the past year? 0  Number falls in past yr: 0  Injury with Fall? 0  Risk for fall due to : No Fall Risks  Follow up Falls evaluation completed      Data saved with a previous flowsheet row definition     Most recent depression screenings:    12/15/2023   10:59 AM 04/16/2022    9:06 AM  PHQ 2/9 Scores  PHQ - 2 Score 0 0  PHQ- 9 Score 0          Patient Care Team: Alvan Dorothyann BIRCH, MD as PCP - General (Family Medicine)   Outpatient Medications Prior to Visit  Medication Sig   allopurinol  (ZYLOPRIM ) 300 MG tablet TAKE ONE TABLET BY MOUTH ONE TIME DAILY   cholecalciferol (VITAMIN D3) 25 MCG (1000 UNIT) tablet Take 1,000 Units by mouth daily.    clindamycin  (CLEOCIN  T) 1 % external solution Apply topically 2 (two) times daily.   omeprazole (PRILOSEC) 10 MG capsule Take 10 mg by mouth once a week.   sildenafil  (VIAGRA ) 100 MG tablet TAKE ONE-HALF TO ONE TABLET BY MOUTH DAILY AS NEEDED FOR ERECTILE DYSFUNCTION   [DISCONTINUED] cefadroxil  (DURICEF) 500 MG capsule Take 1 capsule (500 mg total) by mouth 2 (two) times daily. (Patient not taking: Reported on 12/15/2023)   [DISCONTINUED] doxycycline  (VIBRAMYCIN ) 100 MG capsule Take one cap PO Q12hr with food. (Patient not taking: Reported on 12/15/2023)   [DISCONTINUED] mesalamine  (LIALDA ) 1.2 g EC tablet Take 2 tablets (2.4 g total) by mouth daily with breakfast. (Patient not taking: Reported on 12/15/2023)   [DISCONTINUED] metFORMIN  (GLUCOPHAGE -XR) 500 MG 24 hr tablet Take 1 tablet (500 mg total) by mouth daily with breakfast. (Patient not taking: Reported on 12/15/2023)   No facility-administered medications prior to visit.    ROS        Objective:     BP 138/81 (BP Location: Left Arm, Patient Position: Sitting, Cuff Size: Normal)   Pulse 70   Temp 98.7 F (37.1 C) (  Oral)   Resp 20   Ht 5' 11 (1.803 m)   Wt 216 lb (98 kg)   SpO2 97%   BMI 30.13 kg/m     Physical Exam Constitutional:      Appearance: Normal appearance.  HENT:     Head: Normocephalic and atraumatic.     Right Ear: Tympanic membrane, ear canal and external ear normal.     Left Ear: Tympanic membrane, ear canal and external ear normal.     Nose: Nose normal.     Mouth/Throat:     Pharynx: Oropharynx is clear.  Eyes:     Extraocular Movements: Extraocular movements intact.     Conjunctiva/sclera: Conjunctivae normal.     Pupils: Pupils are equal, round, and reactive to light.  Neck:     Thyroid: No thyromegaly.  Cardiovascular:     Rate and Rhythm: Normal rate and regular rhythm.  Pulmonary:     Effort: Pulmonary effort is normal.     Breath sounds: Normal breath sounds.  Abdominal:     General:  Bowel sounds are normal.     Palpations: Abdomen is soft.     Tenderness: There is no abdominal tenderness.  Musculoskeletal:        General: No swelling.     Cervical back: Neck supple.  Skin:    General: Skin is warm and dry.     Comments: On his neck near the left collar area he has above the clavicle he has a hyperkeratotic skin lesion that is approximately 4 to 5 mm.  Almost forming a horn shape.  Neurological:     Mental Status: He is oriented to person, place, and time.  Psychiatric:        Mood and Affect: Mood normal.        Behavior: Behavior normal.      No results found for any visits on 12/15/23.      Assessment & Plan:    Routine Health Maintenance and Physical Exam  Immunization History  Administered Date(s) Administered   Tdap 11/19/2013   Zoster Recombinant(Shingrix ) 04/16/2022, 06/17/2022    Health Maintenance  Topic Date Due   COVID-19 Vaccine (1) Never done   OPHTHALMOLOGY EXAM  Never done   Diabetic kidney evaluation - Urine ACR  Never done   Pneumococcal Vaccine: 50+ Years (1 of 2 - PCV) Never done   Hepatitis B Vaccines 19-59 Average Risk (1 of 3 - 19+ 3-dose series) Never done   HEMOGLOBIN A1C  10/15/2022   Colonoscopy  01/16/2023   Diabetic kidney evaluation - eGFR measurement  04/17/2023   Influenza Vaccine  Never done   DTaP/Tdap/Td (2 - Td or Tdap) 11/20/2023   FOOT EXAM  12/14/2024   Hepatitis C Screening  Completed   HIV Screening  Completed   Zoster Vaccines- Shingrix   Completed   HPV VACCINES  Aged Out   Meningococcal B Vaccine  Aged Out    Discussed health benefits of physical activity, and encouraged him to engage in regular exercise appropriate for his age and condition.  Problem List Items Addressed This Visit       Endocrine   Uncontrolled diabetes mellitus with hyperglycemia (HCC)   Encouraged him to work on healthy diet he is okay with retrying the metformin  if not successful with using the medication over the next month  and we could consider a flozin.       Relevant Medications   metFORMIN  (GLUCOPHAGE -XR) 500 MG 24 hr tablet  atorvastatin  (LIPITOR) 20 MG tablet   Other Relevant Orders   Urine Microalbumin w/creat. ratio   Hyperlipidemia associated with type 2 diabetes mellitus [facility])   Did discuss that he would benefit from a statin.      Relevant Medications   metFORMIN  (GLUCOPHAGE -XR) 500 MG 24 hr tablet   atorvastatin  (LIPITOR) 20 MG tablet     Musculoskeletal and Integument   Hidradenitis suppurativa   Him seem to be under much better control.  We did discuss how having uncontrolled diabetes can actually worsen at bedtime.  So getting his sugars under control is really important.      Other Visit Diagnoses       Wellness examination    -  Primary   Relevant Orders   HgB A1c   CMP14+EGFR   Lipid Panel With LDL/HDL Ratio   CBC with Differential   Uric acid     Abnormal glucose       Relevant Medications   metFORMIN  (GLUCOPHAGE -XR) 500 MG 24 hr tablet     Skin lesion           Keep up a regular exercise program and make sure you are eating a healthy diet Try to eat 4 servings of dairy a day, or if you are lactose intolerant take a calcium  with vitamin D  daily.  Discussed need for Tdap.he will return.   Shave Biopsy Procedure Note  Pre-operative Diagnosis: Suspicious lesion  Post-operative Diagnosis: same  Locations:left neck above collar bone  Indications: suspicious   Anesthesia: Lidocaine 1% without epinephrine  without added sodium bicarbonate  Procedure Details  Patient informed of the risks (including bleeding and infection) and benefits of the  procedure and Verbal informed consent obtained.  The lesion and surrounding area were given a sterile prep using chlorhexidine and draped in the usual sterile fashion. A scalpel was used to shave an area of skin approximately 6mm by 7mm.  Hemostasis achieved with alumuninum chloride. Antibiotic ointment and a sterile dressing  applied.  The specimen was sent for pathologic examination. The patient tolerated the procedure well.  EBL: trace  Findings: Await pathology   Condition: Stable  Complications: none.  Plan: 1. Instructed to keep the wound dry and covered for 24-48h and clean thereafter. 2. Warning signs of infection were reviewed.   3. Recommended that the patient use OTC acetaminophen as needed for pain.  4. Return PRN.   Return in about 3 months (around 03/15/2024) for Diabetes follow-up.     Dorothyann Byars, MD

## 2023-12-15 NOTE — Assessment & Plan Note (Signed)
 Did discuss that he would benefit from a statin.

## 2023-12-15 NOTE — Patient Instructions (Addendum)
 Keep covered until tomorrow Do not scrub in the area and shower just let the soap and water run over it.  Pat dry.  Apply a dab of Vaseline 2-3 times a day for the next 3 weeks.  You can keep it loosely covered with a Band-Aid if you need to especially since your shirt collar tends to rub there.

## 2023-12-15 NOTE — Assessment & Plan Note (Signed)
 Encouraged him to work on [employer] he is okay with retrying the metformin  if not successful with using the medication over the next month and we could consider a flozin.

## 2023-12-15 NOTE — Assessment & Plan Note (Signed)
 Him seem to be under much better control.  We did discuss how having uncontrolled diabetes can actually worsen at bedtime.  So getting his sugars under control is really important.

## 2023-12-15 NOTE — Addendum Note (Signed)
 Addended by: FREYA BASCOM CROME on: 12/15/2023 04:13 PM   Modules accepted: Orders

## 2023-12-16 ENCOUNTER — Ambulatory Visit: Payer: Self-pay | Admitting: Family Medicine

## 2023-12-16 LAB — CBC WITH DIFFERENTIAL/PLATELET
Basophils Absolute: 0.1 x10E3/uL (ref 0.0–0.2)
Basos: 1 %
EOS (ABSOLUTE): 0.1 x10E3/uL (ref 0.0–0.4)
Eos: 2 %
Hematocrit: 46 % (ref 37.5–51.0)
Hemoglobin: 15.2 g/dL (ref 13.0–17.7)
Immature Grans (Abs): 0.1 x10E3/uL (ref 0.0–0.1)
Immature Granulocytes: 1 %
Lymphocytes Absolute: 3.1 x10E3/uL (ref 0.7–3.1)
Lymphs: 38 %
MCH: 31.5 pg (ref 26.6–33.0)
MCHC: 33 g/dL (ref 31.5–35.7)
MCV: 95 fL (ref 79–97)
Monocytes Absolute: 1 x10E3/uL — ABNORMAL HIGH (ref 0.1–0.9)
Monocytes: 13 %
Neutrophils Absolute: 3.8 x10E3/uL (ref 1.4–7.0)
Neutrophils: 45 %
Platelets: 251 x10E3/uL (ref 150–450)
RBC: 4.82 x10E6/uL (ref 4.14–5.80)
RDW: 13.2 % (ref 11.6–15.4)
WBC: 8.2 x10E3/uL (ref 3.4–10.8)

## 2023-12-16 LAB — CMP14+EGFR
ALT: 71 IU/L — ABNORMAL HIGH (ref 0–44)
AST: 56 IU/L — ABNORMAL HIGH (ref 0–40)
Albumin: 4.4 g/dL (ref 3.8–4.9)
Alkaline Phosphatase: 81 IU/L (ref 47–123)
BUN/Creatinine Ratio: 15 (ref 9–20)
BUN: 15 mg/dL (ref 6–24)
Bilirubin Total: 0.3 mg/dL (ref 0.0–1.2)
CO2: 23 mmol/L (ref 20–29)
Calcium: 9.4 mg/dL (ref 8.7–10.2)
Chloride: 100 mmol/L (ref 96–106)
Creatinine, Ser: 0.97 mg/dL (ref 0.76–1.27)
Globulin, Total: 2.2 g/dL (ref 1.5–4.5)
Glucose: 168 mg/dL — ABNORMAL HIGH (ref 70–99)
Potassium: 4.4 mmol/L (ref 3.5–5.2)
Sodium: 139 mmol/L (ref 134–144)
Total Protein: 6.6 g/dL (ref 6.0–8.5)
eGFR: 92 mL/min/1.73 (ref >59)

## 2023-12-16 LAB — SPECIMEN STATUS REPORT

## 2023-12-16 LAB — MICROALBUMIN / CREATININE URINE RATIO
Creatinine, Urine: 209.1 mg/dL
Microalb/Creat Ratio: 4 mg/g{creat} (ref 0–29)
Microalbumin, Urine: 9.4 ug/mL

## 2023-12-16 LAB — LIPID PANEL WITH LDL/HDL RATIO
Cholesterol, Total: 228 mg/dL — ABNORMAL HIGH (ref 100–199)
HDL: 39 mg/dL — ABNORMAL LOW (ref >39)
LDL Chol Calc (NIH): 143 mg/dL — ABNORMAL HIGH (ref 0–99)
LDL/HDL Ratio: 3.7 ratio — ABNORMAL HIGH (ref 0.0–3.6)
Triglycerides: 250 mg/dL — ABNORMAL HIGH (ref 0–149)
VLDL Cholesterol Cal: 46 mg/dL — ABNORMAL HIGH (ref 5–40)

## 2023-12-16 LAB — URIC ACID: Uric Acid: 6.6 mg/dL (ref 3.8–8.4)

## 2023-12-16 LAB — HEMOGLOBIN A1C
Est. average glucose Bld gHb Est-mCnc: 220 mg/dL
Hgb A1c MFr Bld: 9.3 % — ABNORMAL HIGH (ref 4.8–5.6)

## 2023-12-16 MED ORDER — GLIPIZIDE ER 5 MG PO TB24: 5.0000 mg | ORAL_TABLET | Freq: Every day | ORAL | 0 refills | Status: AC

## 2023-12-16 NOTE — Progress Notes (Signed)
 Hi Andre Henderson, A1c was 9.3, up from 8 last time.  We really need to get you back into the 6 range which is where you were several years ago.  I am going to go ahead and send over prescription for glipizide  to take in addition to the metformin .  This is generic so should be relatively inexpensive and should help lower your A1c by about a point to a point and a half.  Please follow-up with me in about 8 weeks if you can.  Your liver enzymes are also significantly elevated and trending up compared to last year.  Please cut back on any alcohol or Tylenol products that you might be consuming.  Your cholesterol is also off the charts and elevated.  Please make sure you are taking the atorvastatin  that will help reduce your cholesterol numbers as well as reduce your risk for heart attack and stroke.  No excess protein in the urine which is good.  Uric acid level overall looks okay as well

## 2023-12-17 ENCOUNTER — Ambulatory Visit: Payer: Self-pay | Admitting: Family Medicine

## 2023-12-17 LAB — DERMATOLOGY PATHOLOGY

## 2023-12-17 NOTE — Progress Notes (Signed)
 Hi Andre Henderson, just wanted to let you know that the skin lesion came back as a wart that was irritated.  Hopefully cutting it off will take care of it if it starts to grow back let us  know and we can always try to freeze it.  Sometimes these are difficult to get rid of.

## 2024-03-15 ENCOUNTER — Ambulatory Visit: Payer: Self-pay | Admitting: Family Medicine

## 2024-04-02 ENCOUNTER — Encounter: Payer: Self-pay | Admitting: Emergency Medicine

## 2024-04-02 ENCOUNTER — Ambulatory Visit: Payer: Self-pay

## 2024-04-02 ENCOUNTER — Ambulatory Visit
Admission: EM | Admit: 2024-04-02 | Discharge: 2024-04-02 | Disposition: A | Discharge status: Home / Self Care | Payer: Self-pay | Attending: Family Medicine | Admitting: Family Medicine

## 2024-04-02 DIAGNOSIS — M25541 Pain in joints of right hand: Secondary | ICD-10-CM

## 2024-04-02 DIAGNOSIS — S63654A Sprain of metacarpophalangeal joint of right ring finger, initial encounter: Secondary | ICD-10-CM

## 2024-04-02 NOTE — ED Provider Notes (Signed)
 " [facility]    CSN: [id number] Arrival date & time: 04/02/24  1355      History   Chief Complaint Chief Complaint  Patient presents with   Hand Pain    HPI Andre Henderson is a 54 y.o. male.   While playing golf about 2 to 3 months ago, patient experienced sudden localized pain over the fourth MCP joint of his right hand.  The pain persists but is intermittent, occurring primarily when he grasps his golf clubs.  The history is provided by the patient.  Hand Pain This is a new problem. Episode onset: 2 to 3 months ago. Episode frequency: intermittently' The problem has not changed since onset.Exacerbated by: grasping. The symptoms are relieved by rest. Treatments tried: ibuprofen. The treatment provided mild relief.    Past Medical History:  Diagnosis Date   Arthritis    Colitis 2002   Gout     Patient Active Problem List   Diagnosis Date Noted   Hyperlipidemia associated with type 2 diabetes mellitus (HCC) 12/15/2023   Hidradenitis suppurativa 12/15/2023   Erectile dysfunction 10/10/2020   Low libido 10/10/2020   Cervical radiculopathy at C6 10/27/2018   Gastroesophageal reflux disease without esophagitis 10/20/2017   Fatty liver 05/16/2017   Uncontrolled diabetes mellitus with hyperglycemia (HCC) 11/19/2013   Gout 10/15/2013   Tobacco abuse 10/15/2013   Idiopathic chronic gout of right foot without tophus 10/15/2013    Past Surgical History:  Procedure Laterality Date   ANKLE FRACTURE SURGERY Right 2005   COLONOSCOPY  2020   COLONOSCOPY  2002       Home Medications    Prior to Admission medications  Medication Sig Start Date End Date Taking? Authorizing Provider  allopurinol  (ZYLOPRIM ) 300 MG tablet TAKE ONE TABLET BY MOUTH ONE TIME DAILY 09/04/23  Yes Alvia Velma, DO  atorvastatin  (LIPITOR) 20 MG tablet Take 1 tablet (20 mg total) by mouth at bedtime. 12/15/23  Yes Alvan Dorothyann BIRCH, MD  cholecalciferol (VITAMIN D3) 25 MCG (1000 UNIT) tablet  Take 1,000 Units by mouth daily.   Yes [provider]  clindamycin  (CLEOCIN  T) 1 % external solution Apply topically 2 (two) times daily. 09/05/23  Yes Pauline Garnette LABOR, MD  glipiZIDE  (GLUCOTROL  XL) 5 MG 24 hr tablet Take 1 tablet (5 mg total) by mouth daily with breakfast. 12/16/23  Yes Alvan Dorothyann BIRCH, MD  metFORMIN  (GLUCOPHAGE -XR) 500 MG 24 hr tablet Take 1 tablet (500 mg total) by mouth daily with breakfast. 12/15/23  Yes Alvan Dorothyann BIRCH, MD  omeprazole (PRILOSEC) 10 MG capsule Take 10 mg by mouth once a week.   Yes [provider]  sildenafil  (VIAGRA ) 100 MG tablet TAKE ONE-HALF TO ONE TABLET BY MOUTH DAILY AS NEEDED FOR ERECTILE DYSFUNCTION 11/17/23  Yes Alvan Dorothyann BIRCH, MD    Family History Family History  Problem Relation Age of Onset   Diabetes Mother    Hyperlipidemia Mother    Hypertension Mother    Diabetes Paternal Uncle    Crohn's disease Cousin        2nd cousin   Pancreatic cancer Cousin    Colon cancer Neg Hx    Esophageal cancer Neg Hx    Colon polyps Neg Hx    Rectal cancer Neg Hx    Stomach cancer Neg Hx     Social History Social History[1]   Allergies   Patient has no known allergies.   Review of Systems Review of Systems  Constitutional:  Negative for  activity change.  Musculoskeletal:  Positive for joint swelling.  Skin:  Negative for color change and wound.  Neurological:  Negative for numbness.  All other systems reviewed and are negative.    Physical Exam Triage Vital Signs ED Triage Vitals  Encounter Vitals Group     BP 04/02/24 1408 (!) 154/84     Girls Systolic BP Percentile --      Girls Diastolic BP Percentile --      Boys Systolic BP Percentile --      Boys Diastolic BP Percentile --      Pulse Rate 04/02/24 1408 69     Resp 04/02/24 1408 18     Temp 04/02/24 1408 98.6 F (37 C)     Temp Source 04/02/24 1408 Oral     SpO2 04/02/24 1408 98 %     Weight 04/02/24 1407 205 lb (93 kg)     Height  04/02/24 1407 5' 11 (1.803 m)     Head Circumference --      Peak Flow --      Pain Score 04/02/24 1407 6     Pain Loc --      Pain Education --      Exclude from Growth Chart --    No data found.  Updated Vital Signs BP (!) 154/84 (BP Location: Right Arm)   Pulse 69   Temp 98.6 F (37 C) (Oral)   Resp 18   Ht 5' 11 (1.803 m)   Wt 93 kg   SpO2 98%   BMI 28.59 kg/m   Visual Acuity Right Eye Distance:   Left Eye Distance:   Bilateral Distance:    Right Eye Near:   Left Eye Near:    Bilateral Near:     Physical Exam Vitals and nursing note reviewed.  Constitutional:      General: He is not in acute distress. Eyes:     Pupils: Pupils are equal, round, and reactive to light.  Cardiovascular:     Rate and Rhythm: Normal rate.  Pulmonary:     Effort: Pulmonary effort is normal.  Musculoskeletal:       Hands:     Comments: Right hand has distinct tenderness to palpation over the collateral ligaments of the fourth MCP joint.  Medial and lateral movement of the finger while palpating MCP joint causes pain, primarily on the ulnar side of joint.  Finger has full range of motion.  Neurological:     Mental Status: He is alert.      UC Treatments / Results  Labs (all labs ordered are listed, but only abnormal results are displayed) Labs Reviewed - No data to display  EKG   Radiology EXAM: 3 OR MORE VIEW(S) XRAY OF THE RIGHT HAND 04/02/2024 02:18:59 PM   COMPARISON: None available.   CLINICAL HISTORY: right hand pain   FINDINGS:   BONES AND JOINTS: No acute fracture. No malalignment.   SOFT TISSUES: The soft tissues are unremarkable.   IMPRESSION: 1. No acute fracture or dislocation.   Electronically signed by: Waddell Calk MD 04/02/2024 03:07 PM EST RP Workstation: [id number]  Procedures Procedures (including critical care time)  Medications Ordered in UC Medications - No data to display  Initial Impression / Assessment and Plan / UC  Course  I have reviewed the triage vital signs and the nursing notes.  Pertinent labs & imaging results that were available during my care of the patient were reviewed by me and considered in my  medical decision making (see chart for details).    Sprain of collateral ligaments of right second MCP joint. Followup with orthopedist  Final Clinical Impressions(s) / UC Diagnoses   Final diagnoses:  Sprain of metacarpophalangeal (MCP) joint of right ring finger, initial encounter     Discharge Instructions      Wear finger splint until evaluated by orthopedist.  Avoid playing golf for now.    ED Prescriptions   None         [1]  Social History Tobacco Use   Smoking status: Light Smoker    Types: Cigars   Smokeless tobacco: Never   Tobacco comments:    One cigar per month or less  Vaping Use   Vaping status: Never Used  Substance Use Topics   Alcohol use: Yes    Alcohol/week: 10.0 standard drinks of alcohol    Types: 10 Cans of beer per week   Drug use: Never     Pauline Garnette LABOR, MD 04/04/24 1518  "

## 2024-04-02 NOTE — ED Triage Notes (Signed)
 Patient states that he was playing golf about 2-3 months ago and injured his right hand.  Patient said that the pain comes and goes, unsure if the finger is jammed or not.  Patient has taken Ibuprofen for pain.

## 2024-04-02 NOTE — Discharge Instructions (Signed)
 Wear finger splint until evaluated by orthopedist.  Avoid playing golf for now.

## 2024-04-05 ENCOUNTER — Ambulatory Visit: Payer: Self-pay | Admitting: Family Medicine

## 2024-04-05 ENCOUNTER — Encounter: Payer: Self-pay | Admitting: Family Medicine

## 2024-04-05 VITALS — BP 126/66 | HR 61 | Ht 71.0 in | Wt 212.0 lb

## 2024-04-05 DIAGNOSIS — E1165 Type 2 diabetes mellitus with hyperglycemia: Secondary | ICD-10-CM

## 2024-04-05 DIAGNOSIS — Z1211 Encounter for screening for malignant neoplasm of colon: Secondary | ICD-10-CM

## 2024-04-05 LAB — POCT GLYCOSYLATED HEMOGLOBIN (HGB A1C): Hemoglobin A1C: 6.9 % — AB (ref 4.0–5.6)

## 2024-04-05 NOTE — Progress Notes (Signed)
 Pt reports that he jammed his R ring finger. He said that this started after his last OV.  He stated that it feels like theres a rubber band popping when he moves his hand a certain way. He doesn't feel any pain if he balls up his fist, there is no swelling unless he's been using his hand, and he gets some pain with lateral movement.  He went to UC on Friday and they did x rays nothing was broken.

## 2024-04-05 NOTE — Progress Notes (Signed)
 "  Acute Office Visit  Patient ID: Andre Henderson, male    DOB: 1971/03/27, 54 y.o.   MRN: [id number]  PCP: Alvan Dorothyann BIRCH, MD  Chief Complaint  Patient presents with   Hand Pain    Subjective:     HPI  Discussed the use of AI scribe software for clinical note transcription with the patient, who gave verbal consent to proceed.  History of Present Illness Andre Henderson is a 54 year old male who presents with finger pain and possible tendon injury.  Finger pain and possible tendon injury, 4th finger right hand.  - Onset in September while playing golf, experienced a 'twang' sensation similar to a tight rubber band snapping - Initial pain subsided but recurred with activity, especially during golf - Pain exacerbated by cold weather and certain movements, such as petting his dog - Pain is lateral and sometimes wakes him at night - Numbness occurs after a few golf shots - Larger objects cause less pain when gripped; golf club exacerbates pain - Ibuprofen taken before playing golf for pain management - Rest from golf for 2-3 weeks did not resolve symptoms - X-ray ruled out fracture - Recent use of a splint has provided some relief  Buttock pain, on right  - Localized to the buttock, does not radiate down the leg - Worse at night, causing restlessness and difficulty finding a comfortable position - Attempts to alleviate discomfort with pillows  Tension headaches at back of skull and top of neck  - Onset over the past week - Located at the back of the head - Associated with tension in the neck and shoulders - Attributed to stress and possible changes in sugar intake - Relief with Goody's powder  Diabetes mellitus management - Managed with metformin  and glipizide  - A1c improved from 9.3 to 6.9 - Takes medication at night to avoid stomach upset - Has a routine to ensure medication adherence   ROS     Objective:    BP 126/66   Pulse 61   Ht 5' 11 (1.803 m)   Wt 212 lb  (96.2 kg)   SpO2 96%   BMI 29.57 kg/m    Physical Exam Vitals reviewed.  Constitutional:      Appearance: Normal appearance.  HENT:     Head: Normocephalic.  Pulmonary:     Effort: Pulmonary effort is normal.  Musculoskeletal:     Comments: Nontender lumbar spine. Stretch laying on back with hip external and internal rotation felt good.  Mild tenderness over the MCP on the 4th finger. No swelling good grip. Strength 5/5. Nontender over posterior neck.   Neurological:     Mental Status: He is alert and oriented to person, place, and time.  Psychiatric:        Mood and Affect: Mood normal.        Behavior: Behavior normal.       Results for orders placed or performed in visit on 04/05/24  POCT HgB A1C  Result Value Ref Range   Hemoglobin A1C 6.9 (A) 4.0 - 5.6 %   HbA1c POC (<> result, manual entry)     HbA1c, POC (prediabetic range)     HbA1c, POC (controlled diabetic range)         Assessment & Plan:   Problem List Items Addressed This Visit       Endocrine   Uncontrolled diabetes mellitus with hyperglycemia (HCC)   Relevant Orders   POCT HgB A1C (Completed)   Other  Visit Diagnoses       Screening for colon cancer    -  Primary   Relevant Orders   Ambulatory referral to Gastroenterology       Assessment and Plan Assessment & Plan Finger tendon strain Chronic pain in the fourth finger, likely tendon strain or minor tear. Pain exacerbated by lateral movements and gripping. No fracture or significant weakness. - Recommended hand rehabilitation exercises and stretches. - Advised use of a splint to limit use and remind of rest. - Consider referral to sports medicine for ultrasound evaluation if no improvement. - Encouraged rest and gradual return to activities.  Sciatica Pain localized to the buttock, possible piriformis syndrome or sciatic nerve impingement. Symptoms worsen at night, no spinal impingement indicated. - Provided stretches targeting the  piriformis muscle and sciatic nerve. - Advised on sleeping positions and use of a pillow between knees. - Encouraged regular stretching exercises every three hours.  Type 2 diabetes mellitus with hyperglycemia A1c improved from 9.3% to 6.9%, indicating better glycemic control. Current management with metformin  and glipizide  is well-tolerated. - Continue current medications: metformin  and glipizide . - Encouraged adherence to medication regimen and lifestyle modifications.  Tension-type headache Recent onset of tension-type headaches, likely related to stress and posture. Pain relieved by over-the-counter medication. - Recommended ergonomic adjustments to workstation. - Advised on regular stretching exercises for neck and shoulders. - Encouraged use of a standing desk to alleviate posture-related tension.    No orders of the defined types were placed in this encounter.   Return in about 4 months (around 08/03/2024) for Diabetes follow-up.  Dorothyann Byars, MD [facility]   "

## 2024-05-03 ENCOUNTER — Ambulatory Visit: Payer: Self-pay | Admitting: Family Medicine

## 2024-08-03 ENCOUNTER — Ambulatory Visit: Payer: Self-pay | Admitting: Family Medicine
